# Patient Record
Sex: Female | Born: 1955 | Race: White | Hispanic: No | Marital: Married | Smoking: Former smoker
Health system: Southern US, Community
[De-identification: ages and names within clinical notes are randomized; demographics above are authoritative.]

## PROBLEM LIST (undated history)

## (undated) DIAGNOSIS — M1990 Unspecified osteoarthritis, unspecified site: Secondary | ICD-10-CM

## (undated) DIAGNOSIS — Z9889 Other specified postprocedural states: Secondary | ICD-10-CM

## (undated) DIAGNOSIS — R251 Tremor, unspecified: Secondary | ICD-10-CM

## (undated) DIAGNOSIS — E2839 Other primary ovarian failure: Secondary | ICD-10-CM

## (undated) DIAGNOSIS — K5792 Diverticulitis of intestine, part unspecified, without perforation or abscess without bleeding: Secondary | ICD-10-CM

## (undated) DIAGNOSIS — F419 Anxiety disorder, unspecified: Secondary | ICD-10-CM

## (undated) DIAGNOSIS — K219 Gastro-esophageal reflux disease without esophagitis: Secondary | ICD-10-CM

## (undated) DIAGNOSIS — R112 Nausea with vomiting, unspecified: Secondary | ICD-10-CM

## (undated) DIAGNOSIS — E785 Hyperlipidemia, unspecified: Secondary | ICD-10-CM

## (undated) DIAGNOSIS — N3281 Overactive bladder: Secondary | ICD-10-CM

## (undated) DIAGNOSIS — I10 Essential (primary) hypertension: Secondary | ICD-10-CM

## (undated) DIAGNOSIS — R7303 Prediabetes: Secondary | ICD-10-CM

## (undated) DIAGNOSIS — Z720 Tobacco use: Secondary | ICD-10-CM

## (undated) DIAGNOSIS — E669 Obesity, unspecified: Secondary | ICD-10-CM

## (undated) DIAGNOSIS — F32A Depression, unspecified: Secondary | ICD-10-CM

## (undated) DIAGNOSIS — T8859XA Other complications of anesthesia, initial encounter: Secondary | ICD-10-CM

## (undated) DIAGNOSIS — E559 Vitamin D deficiency, unspecified: Secondary | ICD-10-CM

## (undated) HISTORY — PX: ABDOMINAL HYSTERECTOMY: SHX81

## (undated) HISTORY — PX: BACK SURGERY: SHX140

## (undated) HISTORY — DX: Tremor, unspecified: R25.1

## (undated) HISTORY — DX: Depression, unspecified: F32.A

## (undated) HISTORY — DX: Other primary ovarian failure: E28.39

## (undated) HISTORY — DX: Vitamin D deficiency, unspecified: E55.9

## (undated) HISTORY — DX: Hyperlipidemia, unspecified: E78.5

## (undated) HISTORY — DX: Unspecified osteoarthritis, unspecified site: M19.90

## (undated) HISTORY — DX: Prediabetes: R73.03

## (undated) HISTORY — DX: Obesity, unspecified: E66.9

## (undated) HISTORY — DX: Anxiety disorder, unspecified: F41.9

## (undated) HISTORY — PX: WISDOM TOOTH EXTRACTION: SHX21

## (undated) HISTORY — DX: Gastro-esophageal reflux disease without esophagitis: K21.9

## (undated) HISTORY — DX: Overactive bladder: N32.81

---

## 1998-05-30 ENCOUNTER — Ambulatory Visit (HOSPITAL_COMMUNITY): Admission: RE | Admit: 1998-05-30 | Discharge: 1998-05-30 | Payer: Self-pay | Admitting: Family Medicine

## 1998-07-17 ENCOUNTER — Encounter: Admission: RE | Admit: 1998-07-17 | Discharge: 1998-09-16 | Payer: Self-pay | Admitting: Neurosurgery

## 1998-08-21 ENCOUNTER — Ambulatory Visit (HOSPITAL_COMMUNITY): Admission: RE | Admit: 1998-08-21 | Discharge: 1998-08-21 | Payer: Self-pay | Admitting: Neurosurgery

## 1998-08-21 ENCOUNTER — Encounter: Payer: Self-pay | Admitting: Neurosurgery

## 1998-09-04 ENCOUNTER — Encounter: Payer: Self-pay | Admitting: Neurosurgery

## 1998-09-04 ENCOUNTER — Ambulatory Visit (HOSPITAL_COMMUNITY): Admission: RE | Admit: 1998-09-04 | Discharge: 1998-09-04 | Payer: Self-pay | Admitting: Neurosurgery

## 1998-09-18 ENCOUNTER — Encounter: Payer: Self-pay | Admitting: Neurosurgery

## 1998-09-18 ENCOUNTER — Ambulatory Visit (HOSPITAL_COMMUNITY): Admission: RE | Admit: 1998-09-18 | Discharge: 1998-09-18 | Payer: Self-pay | Admitting: Neurosurgery

## 1998-10-09 ENCOUNTER — Encounter: Payer: Self-pay | Admitting: Neurosurgery

## 1998-10-11 ENCOUNTER — Inpatient Hospital Stay (HOSPITAL_COMMUNITY): Admission: RE | Admit: 1998-10-11 | Discharge: 1998-10-12 | Payer: Self-pay | Admitting: Neurosurgery

## 1998-10-11 ENCOUNTER — Encounter: Payer: Self-pay | Admitting: Neurosurgery

## 1998-12-11 ENCOUNTER — Encounter: Payer: Self-pay | Admitting: Neurosurgery

## 1998-12-11 ENCOUNTER — Ambulatory Visit (HOSPITAL_COMMUNITY): Admission: RE | Admit: 1998-12-11 | Discharge: 1998-12-11 | Payer: Self-pay | Admitting: Neurosurgery

## 1999-09-04 ENCOUNTER — Encounter: Admission: RE | Admit: 1999-09-04 | Discharge: 1999-09-04 | Payer: Self-pay | Admitting: Family Medicine

## 1999-09-04 ENCOUNTER — Encounter: Payer: Self-pay | Admitting: Family Medicine

## 1999-12-26 ENCOUNTER — Encounter: Admission: RE | Admit: 1999-12-26 | Discharge: 1999-12-26 | Payer: Self-pay | Admitting: Family Medicine

## 2000-01-20 ENCOUNTER — Emergency Department (HOSPITAL_COMMUNITY): Admission: EM | Admit: 2000-01-20 | Discharge: 2000-01-20 | Payer: Self-pay | Admitting: Internal Medicine

## 2000-01-20 ENCOUNTER — Encounter: Payer: Self-pay | Admitting: Emergency Medicine

## 2000-01-27 ENCOUNTER — Encounter: Admission: RE | Admit: 2000-01-27 | Discharge: 2000-01-27 | Payer: Self-pay | Admitting: Family Medicine

## 2000-01-27 ENCOUNTER — Encounter: Payer: Self-pay | Admitting: Family Medicine

## 2000-03-08 ENCOUNTER — Encounter: Payer: Self-pay | Admitting: Family Medicine

## 2000-03-08 ENCOUNTER — Ambulatory Visit (HOSPITAL_COMMUNITY): Admission: RE | Admit: 2000-03-08 | Discharge: 2000-03-08 | Payer: Self-pay | Admitting: Family Medicine

## 2000-04-23 ENCOUNTER — Other Ambulatory Visit: Admission: RE | Admit: 2000-04-23 | Discharge: 2000-04-23 | Payer: Self-pay | Admitting: Obstetrics and Gynecology

## 2000-08-31 ENCOUNTER — Inpatient Hospital Stay (HOSPITAL_COMMUNITY): Admission: EM | Admit: 2000-08-31 | Discharge: 2000-09-01 | Payer: Self-pay | Admitting: Emergency Medicine

## 2000-08-31 ENCOUNTER — Encounter: Payer: Self-pay | Admitting: Internal Medicine

## 2000-09-01 ENCOUNTER — Encounter: Payer: Self-pay | Admitting: Internal Medicine

## 2000-10-27 ENCOUNTER — Ambulatory Visit (HOSPITAL_COMMUNITY): Admission: RE | Admit: 2000-10-27 | Discharge: 2000-10-27 | Payer: Self-pay | Admitting: Internal Medicine

## 2000-12-14 ENCOUNTER — Encounter: Payer: Self-pay | Admitting: Family Medicine

## 2000-12-14 ENCOUNTER — Encounter: Admission: RE | Admit: 2000-12-14 | Discharge: 2000-12-14 | Payer: Self-pay | Admitting: Family Medicine

## 2001-01-14 ENCOUNTER — Encounter: Payer: Self-pay | Admitting: Family Medicine

## 2001-01-14 ENCOUNTER — Encounter: Admission: RE | Admit: 2001-01-14 | Discharge: 2001-01-14 | Payer: Self-pay | Admitting: Family Medicine

## 2001-10-23 ENCOUNTER — Emergency Department (HOSPITAL_COMMUNITY): Admission: EM | Admit: 2001-10-23 | Discharge: 2001-10-24 | Payer: Self-pay | Admitting: Emergency Medicine

## 2001-10-23 ENCOUNTER — Encounter: Payer: Self-pay | Admitting: Emergency Medicine

## 2002-02-15 ENCOUNTER — Encounter: Admission: RE | Admit: 2002-02-15 | Discharge: 2002-02-15 | Payer: Self-pay | Admitting: Nurse Practitioner

## 2002-02-15 ENCOUNTER — Encounter: Payer: Self-pay | Admitting: Family Medicine

## 2002-11-28 ENCOUNTER — Other Ambulatory Visit: Admission: RE | Admit: 2002-11-28 | Discharge: 2002-11-28 | Payer: Self-pay | Admitting: Gynecology

## 2003-02-16 ENCOUNTER — Ambulatory Visit (HOSPITAL_BASED_OUTPATIENT_CLINIC_OR_DEPARTMENT_OTHER): Admission: RE | Admit: 2003-02-16 | Discharge: 2003-02-16 | Payer: Self-pay | Admitting: Urology

## 2003-03-05 ENCOUNTER — Encounter: Payer: Self-pay | Admitting: Family Medicine

## 2003-03-05 ENCOUNTER — Ambulatory Visit (HOSPITAL_COMMUNITY): Admission: RE | Admit: 2003-03-05 | Discharge: 2003-03-05 | Payer: Self-pay | Admitting: Family Medicine

## 2003-03-30 ENCOUNTER — Encounter: Payer: Self-pay | Admitting: Family Medicine

## 2003-03-30 ENCOUNTER — Encounter: Admission: RE | Admit: 2003-03-30 | Discharge: 2003-03-30 | Payer: Self-pay | Admitting: Family Medicine

## 2004-03-17 ENCOUNTER — Encounter: Admission: RE | Admit: 2004-03-17 | Discharge: 2004-03-17 | Payer: Self-pay | Admitting: Family Medicine

## 2004-10-21 ENCOUNTER — Encounter: Admission: RE | Admit: 2004-10-21 | Discharge: 2004-10-21 | Payer: Self-pay | Admitting: Family Medicine

## 2004-10-23 ENCOUNTER — Encounter: Admission: RE | Admit: 2004-10-23 | Discharge: 2004-10-23 | Payer: Self-pay | Admitting: Family Medicine

## 2005-01-05 ENCOUNTER — Encounter: Admission: RE | Admit: 2005-01-05 | Discharge: 2005-01-05 | Payer: Self-pay | Admitting: Family Medicine

## 2005-03-30 ENCOUNTER — Emergency Department (HOSPITAL_COMMUNITY): Admission: EM | Admit: 2005-03-30 | Discharge: 2005-03-30 | Payer: Self-pay | Admitting: Emergency Medicine

## 2005-03-30 ENCOUNTER — Encounter: Admission: RE | Admit: 2005-03-30 | Discharge: 2005-03-30 | Payer: Self-pay | Admitting: Obstetrics and Gynecology

## 2005-12-16 ENCOUNTER — Encounter: Payer: Self-pay | Admitting: Family Medicine

## 2005-12-23 ENCOUNTER — Ambulatory Visit: Payer: Self-pay | Admitting: Internal Medicine

## 2005-12-28 ENCOUNTER — Ambulatory Visit: Payer: Self-pay | Admitting: Internal Medicine

## 2006-02-05 ENCOUNTER — Ambulatory Visit: Payer: Self-pay | Admitting: Internal Medicine

## 2006-04-02 ENCOUNTER — Encounter: Admission: RE | Admit: 2006-04-02 | Discharge: 2006-04-02 | Payer: Self-pay | Admitting: *Deleted

## 2006-04-14 ENCOUNTER — Encounter: Admission: RE | Admit: 2006-04-14 | Discharge: 2006-04-14 | Payer: Self-pay | Admitting: *Deleted

## 2007-01-24 ENCOUNTER — Encounter: Admission: RE | Admit: 2007-01-24 | Discharge: 2007-01-24 | Payer: Self-pay | Admitting: *Deleted

## 2007-08-25 ENCOUNTER — Encounter: Admission: RE | Admit: 2007-08-25 | Discharge: 2007-08-25 | Payer: Self-pay | Admitting: Family Medicine

## 2007-08-31 ENCOUNTER — Encounter: Admission: RE | Admit: 2007-08-31 | Discharge: 2007-08-31 | Payer: Self-pay

## 2008-01-09 ENCOUNTER — Emergency Department (HOSPITAL_COMMUNITY): Admission: EM | Admit: 2008-01-09 | Discharge: 2008-01-10 | Payer: Self-pay | Admitting: Emergency Medicine

## 2008-09-25 ENCOUNTER — Encounter: Admission: RE | Admit: 2008-09-25 | Discharge: 2008-09-25 | Payer: Self-pay | Admitting: Family Medicine

## 2008-12-30 ENCOUNTER — Ambulatory Visit: Payer: Self-pay | Admitting: Infectious Diseases

## 2008-12-30 ENCOUNTER — Inpatient Hospital Stay (HOSPITAL_COMMUNITY): Admission: EM | Admit: 2008-12-30 | Discharge: 2009-01-03 | Payer: Self-pay | Admitting: Emergency Medicine

## 2009-09-27 ENCOUNTER — Encounter: Admission: RE | Admit: 2009-09-27 | Discharge: 2009-09-27 | Payer: Self-pay | Admitting: Family Medicine

## 2009-12-17 ENCOUNTER — Encounter: Admission: RE | Admit: 2009-12-17 | Discharge: 2009-12-17 | Payer: Self-pay | Admitting: Family Medicine

## 2010-08-24 ENCOUNTER — Emergency Department (HOSPITAL_COMMUNITY)
Admission: EM | Admit: 2010-08-24 | Discharge: 2010-08-25 | Payer: Self-pay | Source: Home / Self Care | Admitting: Emergency Medicine

## 2010-09-01 LAB — POCT I-STAT, CHEM 8
BUN: 11 mg/dL (ref 6–23)
Calcium, Ion: 1.08 mmol/L — ABNORMAL LOW (ref 1.12–1.32)
Chloride: 107 mEq/L (ref 96–112)
Creatinine, Ser: 0.8 mg/dL (ref 0.4–1.2)
Glucose, Bld: 118 mg/dL — ABNORMAL HIGH (ref 70–99)
HCT: 37 % (ref 36.0–46.0)
Hemoglobin: 12.6 g/dL (ref 12.0–15.0)
Potassium: 3.5 mEq/L (ref 3.5–5.1)
Sodium: 143 mEq/L (ref 135–145)
TCO2: 23 mmol/L (ref 0–100)

## 2010-09-07 ENCOUNTER — Encounter: Payer: Self-pay | Admitting: *Deleted

## 2010-10-14 ENCOUNTER — Other Ambulatory Visit: Payer: Self-pay | Admitting: Family Medicine

## 2010-10-14 DIAGNOSIS — Z1231 Encounter for screening mammogram for malignant neoplasm of breast: Secondary | ICD-10-CM

## 2010-10-21 ENCOUNTER — Ambulatory Visit: Payer: Self-pay

## 2010-10-24 ENCOUNTER — Ambulatory Visit
Admission: RE | Admit: 2010-10-24 | Discharge: 2010-10-24 | Disposition: A | Discharge status: Home / Self Care | Payer: BC Managed Care – PPO | Source: Ambulatory Visit | Attending: Family Medicine | Admitting: Family Medicine

## 2010-10-24 DIAGNOSIS — Z1231 Encounter for screening mammogram for malignant neoplasm of breast: Secondary | ICD-10-CM

## 2010-11-25 LAB — BASIC METABOLIC PANEL
BUN: 2 mg/dL — ABNORMAL LOW (ref 6–23)
BUN: 7 mg/dL (ref 6–23)
CO2: 25 mEq/L (ref 19–32)
Chloride: 106 mEq/L (ref 96–112)
Chloride: 107 mEq/L (ref 96–112)
Creatinine, Ser: 0.65 mg/dL (ref 0.4–1.2)
Creatinine, Ser: 0.8 mg/dL (ref 0.4–1.2)
Glucose, Bld: 124 mg/dL — ABNORMAL HIGH (ref 70–99)

## 2010-11-25 LAB — COMPREHENSIVE METABOLIC PANEL
AST: 16 U/L (ref 0–37)
AST: 35 U/L (ref 0–37)
Albumin: 2.6 g/dL — ABNORMAL LOW (ref 3.5–5.2)
Albumin: 3.8 g/dL (ref 3.5–5.2)
Alkaline Phosphatase: 64 U/L (ref 39–117)
BUN: 8 mg/dL (ref 6–23)
Chloride: 105 mEq/L (ref 96–112)
Chloride: 107 mEq/L (ref 96–112)
Creatinine, Ser: 0.7 mg/dL (ref 0.4–1.2)
GFR calc Af Amer: >60 mL/min (ref >60)
GFR calc Af Amer: >60 mL/min (ref >60)
Potassium: 3.4 mEq/L — ABNORMAL LOW (ref 3.5–5.1)
Potassium: 3.5 mEq/L (ref 3.5–5.1)
Total Bilirubin: 0.8 mg/dL (ref 0.3–1.2)
Total Bilirubin: 1 mg/dL (ref 0.3–1.2)
Total Protein: 7.7 g/dL (ref 6.0–8.3)

## 2010-11-25 LAB — CBC
MCHC: 34.4 g/dL (ref 30.0–36.0)
MCV: 91.9 fL (ref 78.0–100.0)
MCV: 92.2 fL (ref 78.0–100.0)
MCV: 92.8 fL (ref 78.0–100.0)
Platelets: 156 10*3/uL (ref 150–400)
Platelets: 175 10*3/uL (ref 150–400)
Platelets: 200 10*3/uL (ref 150–400)
Platelets: 213 10*3/uL (ref 150–400)
RDW: 13 % (ref 11.5–15.5)
RDW: 13.8 % (ref 11.5–15.5)
WBC: 11.2 10*3/uL — ABNORMAL HIGH (ref 4.0–10.5)
WBC: 12.8 10*3/uL — ABNORMAL HIGH (ref 4.0–10.5)
WBC: 7.3 10*3/uL (ref 4.0–10.5)
WBC: 9.8 10*3/uL (ref 4.0–10.5)

## 2010-11-25 LAB — DIFFERENTIAL
Basophils Absolute: 0 10*3/uL (ref 0.0–0.1)
Basophils Absolute: 0 10*3/uL (ref 0.0–0.1)
Basophils Relative: 0 % (ref 0–1)
Eosinophils Relative: 0 % (ref 0–5)
Eosinophils Relative: 1 % (ref 0–5)
Lymphocytes Relative: 18 % (ref 12–46)
Lymphocytes Relative: 9 % — ABNORMAL LOW (ref 12–46)
Lymphs Abs: 1.3 10*3/uL (ref 0.7–4.0)
Monocytes Absolute: 0.5 10*3/uL (ref 0.1–1.0)
Monocytes Absolute: 0.7 10*3/uL (ref 0.1–1.0)
Monocytes Relative: 7 % (ref 3–12)
Neutro Abs: 5.5 10*3/uL (ref 1.7–7.7)

## 2010-11-25 LAB — URINALYSIS, ROUTINE W REFLEX MICROSCOPIC
Ketones, ur: NEGATIVE mg/dL
Protein, ur: NEGATIVE mg/dL
Urobilinogen, UA: 0.2 mg/dL (ref 0.0–1.0)

## 2010-11-25 LAB — CULTURE, BLOOD (ROUTINE X 2): Culture: NO GROWTH

## 2010-12-30 NOTE — H&P (Signed)
NAME:  Lori Lester, Lori Lester NO.:  [id number]   MEDICAL RECORD NO.:  [id number]          PATIENT TYPE:  INP   LOCATION:  1539                         FACILITY:  [facility]   PHYSICIAN:  Mick Sell, MD DATE OF BIRTH:  1956/07/29   DATE OF ADMISSION:  12/31/2008  DATE OF DISCHARGE:                              HISTORY & PHYSICAL   PRIMARY CARE PHYSICIAN:  Robert A. Nicholos Johns, M.D. with [facility].   CHIEF COMPLAINT:  Abdominal pain.   HISTORY OF PRESENT ILLNESS:  This is a 55 year old woman with a history  of prior diverticulitis and colonic polyps as well as GERD who came to  the emergency room today with 3-4 days of increasing abdominal pain in  the left lower quadrant.  She had some diarrhea with this as well but no  nausea or vomiting.  She denied subjective fevers or chills.  The pain  worsened, she could not take any more, and she came into the emergency  room.  In the ED, she had a CT scan of her abdomen that showed  diverticulitis and ovarian cyst.  She received Cipro, Flagyl, Zofran,  Dilaudid and Ativan in the ED.  Currently she is sleepy but in no acute  distress.   PAST MEDICAL HISTORY:  1. Diverticulitis 3-4 years ago requiring admission  2. GERD.  3. Recurrent ovarian cysts.   PAST SURGICAL HISTORY:  1. Back surgery.  2. Partial hysterectomy.   SOCIAL HISTORY:  The patient lives with her husband, does not smoke.   MEDICATIONS:  1. Prilosec.  2. Vitamin B12 5000 mg every 3 weeks.   ALLERGIES:  CODEINE.   REVIEW OF SYSTEMS:  Eleven systems reviewed and negative except as per  HPI.   PHYSICAL EXAMINATION:  VITAL SIGNS:  The patient's temperature 102.9,  pulse 86, blood pressure 112/56 to 145/76, respirations 20, sating 94%  on room air.  GENERAL:  She is sleepy as it is the middle of the night, and in no  acute distress.  HEENT:  Pupils equal, round, react to light and accommodation.  Extraocular movements are intact.  Sclerae  anicteric.  Oropharynx clear.  NECK:  Supple.  HEART:  Regular with no murmurs.  LUNGS:  Clear to auscultation.  ABDOMEN:  Soft, mildly distended.  Positive tender to palpation in the  left lower quadrant and left flank without any rebound or guarding.  EXTREMITIES:  No clubbing, cyanosis or edema.  NEUROLOGICAL:  She is alert and oriented x3 with a grossly nonfocal  neuro exam.   STUDIES:  CT scan was done today.  She had diverticulitis of the lower  descending colon without focal abscess, but with inflammatory stranding.  She also had a left adnexal cyst.  Ultrasound of the left adnexal mass  revealed a 7 cm simple fluid-filled cyst.   LABORATORY DATA:  Sodium 139, potassium 3.4, bicarb 24, chloride 101,  BUN 8, creatinine 0.7, glucose 112, calcium 9.0, albumin 3.8, LFTs  within normal limits.  White count of 7.3, hemoglobin 13.6, platelets  213.   IMPRESSION:  1. Diverticulitis.  2. Fevers.  3. Left  adnexal mass.   RECOMMENDATION:  Will admit the patient for IV fluids and IV  antibiotics.  Place her on Cipro and Flagyl IV.  Will control her pain  and nausea with Dilaudid and Zofran.  We will give her IV fluids and  advance her from a clear diet as tolerated.  I do not think she would  meet criteria for surgical management unless she should perforate.  Her  last diverticulitis was several years ago, although, she does report a  history also of polyps and may warrant repeat colonoscopy as an  outpatient.  For her adnexal cysts, would recommend follow-up with  gynecology as an outpatient.      Mick Sell, MD  Electronically Signed     DPF/MEDQ  D:  12/31/2008  T:  12/31/2008  Job:  [id number]

## 2011-01-02 NOTE — Discharge Summary (Signed)
NAME:  Lori Lester, Lori Lester           ACCOUNT NO.:  [id number]   MEDICAL RECORD NO.:  [id number]          PATIENT TYPE:  INP   LOCATION:  1539                         FACILITY:  [facility]   PHYSICIAN:  Corinna L. Lendell Caprice, MDDATE OF BIRTH:  10/31/1955   DATE OF ADMISSION:  12/30/2008  DATE OF DISCHARGE:  01/03/2009                               DISCHARGE SUMMARY   DISCHARGE DIAGNOSES:  1. Acute diverticulitis.  2. Anxiety.  3. Left adnexal cyst.  4. Gastroesophageal reflux disease.  5. Partial hysterectomy.   DISCHARGE MEDICATIONS:  1. Ciprofloxacin 500 mg twice daily until gone.  2. Metronidazole 500 mg 8 times daily until gone.  3. Darvocet one to two every 4 hours as needed for pain.  4. Phenergan 25 mg as needed for nausea.  5. Continue Prilosec 40 mg a day.  6. Vitamin B12 injections.   FOLLOW UP:  Gynecologist in 1 month.  Follow up with primary care  physician.  Follow up with [facility] in 2 months.   CONDITION:  Stable.   CONSULTATIONS:  None.   PROCEDURES:  None.   LABS:  CBC on admission unremarkable basic metabolic panel on admission  significant for potassium of 3.4.  Liver function tests unremarkable.  Urinalysis negative blood cultures negative.   SPECIAL STUDIES:  Radiology CT of the abdomen and pelvis showed  inflammation in the pericolonic fat of the descending colon.  No  abscess.  Large cystic structure in the left adnexal region.  Ultrasound  of the pelvis showed left adnexal cyst measuring 7 cm appearing to  contain simple fluid.  Given size, however, followup is warranted.  Chest x-ray on the 19th showed low lung volumes and basilar atelectasis.   DISCHARGE DIET:  High fiber.   DISCHARGE ACTIVITY:  Ad lib.  Return to work on Jan 14, 2009.   HISTORY AND HOSPITAL COURSE:  Ms. Heiser is a 55 year old white  female who presented with abdominal pain.  Please see H and P for  details.  She was febrile to a temperature of 102.9 and had otherwise  normal  vital signs.  She had mildly distended abdomen with tenderness in  the left lower quadrant and flank.  No rebound or guarding.  CAT scan  was consistent with diverticulitis.  Incidentally found was left adnexal  cyst.  The patient was admitted for IV antibiotics.  She was n.p.o.  given pain medications nausea medications.  Her symptoms improved and  her diet was advanced.  She did have a lot of anxiety during the  hospitalization.  She does have a history of diverticulitis in the past  and therefore I have recommended she follow up with her previous  gastroenterologist.  She also has not seen her gynecologist or had a  full pelvic exam for several years and so I have recommended she follow  up with her gynecologist to evaluate the cyst which is most likely  benign.  By the time of discharge she was afebrile, tolerating a diet.  Her pain and nausea were much better.  She was ambulating and had normal  vital signs.  Total time on  the day of discharge is 45 minutes.      Corinna L. Lendell Caprice, MD  Electronically Signed     CLS/MEDQ  D:  01/24/2009  T:  01/24/2009  Job:  [id number]

## 2011-01-02 NOTE — Discharge Summary (Signed)
[place]. [facility]  Patient:    Lori Lester, Lori Lester                       MRN: [id number] Adm. Date:  [id number] Disc. Date: [id number] Attending:  Pricilla Riffle Dictator:   Delton See, P.A. CC:         Elvina Sidle, M.D.   Referring Physician Discharge [place]  HISTORY ON ADMISSION:  [name] is a 55 year old female admitted to [facility] on August 31, 2000 for evaluation of chest pain.  She had been having episodes of chest pain associated with diaphoresis, nausea, and vomiting for greater than one year.  These occur at rest and often last hours. Saturday prior to the admission she had a severe episode followed by chest pressure that has continued intermittently since that episode.  She denies exertional chest pain but does have dyspnea on exertion.  She was admitted for further evaluation.  ALLERGIES:  CODEINE.  MEDICATIONS:  None prior to admission.  PAST MEDICAL HISTORY:  Significant for back surgery, status post partial hysterectomy, history of elevated cholesterol levels.  SOCIAL HISTORY:  The patient is married, she has two children.  She smokes one pack every three days.  She has a five pack-year history of tobacco use.  The patient works in accounts payable.  HOSPITAL COURSE:  As noted, this patient was admitted to [facility] for further evaluation of chest pain.  Her pain was atypical although she has a strong family history of early coronary artery disease as well as hyperlipidemia and a history of tobacco use.  She underwent an exercise Cardiolite procedure on September 01, 2000.  This was interpreted as negative for ischemia, EF 57%, and essentially was a normal study.  A GI consult was obtained from Dr. Marina Goodell.  The patient had an upper endoscopy performed on September 01, 2000.  She was found to have a hiatal hernia, esophagitis, esophageal stricture, and duodenitis without hemorrhage.  It was recommended that  she lose weight, quit smoking, and she was placed on Protonix 40 mg daily to be taken indefinitely.  Arrangements were made to discharge the patient later that evening in improved condition.  LABORATORY DATA:  Chest x-ray was within normal limits.  Cardiac enzymes were negative.  A urinalysis was negative.  A PTT was 25, INR 1.8.  Chemistries were within normal limits.  A CBC revealed a mild anemia with hemoglobin 11.7; hematocrit 35; wbcs 8200; platelets 268,000.  DISCHARGE MEDICATION:  Protonix.  ACTIVITY:  As tolerated.  The patient was told she could return to work Monday.  DIET:  She was to stay on her previous diet.  SPECIAL INSTRUCTIONS:  She was to try to lose weight and try to quit smoking.  FOLLOW-UP:  She was to follow up with Dr. Milus Glazier as needed or as scheduled.  PROBLEM LIST AT TIME OF DISCHARGE: 1. Chest pain with negative cardiac enzymes. 2. Negative exercise Cardiolite performed September 01, 2000.  No ischemia,    ejection fraction 57%. 3. Gastrointestinal consult with upper endoscopy September 01, 2000 revealing    hiatal hernia, inflammation, stricture, and esophagitis, with duodenitis    without hemorrhage. 4. History of tobacco use. 5. Obesity. 6. Strong family history of early coronary disease. 7. History of back surgery. 8. History of elevated cholesterol levels. DD:  09/01/00 TD:  09/02/00 Job: [id number]

## 2011-01-24 ENCOUNTER — Encounter: Payer: Self-pay | Admitting: Internal Medicine

## 2011-05-13 LAB — LIPASE, BLOOD: Lipase: 19

## 2011-05-13 LAB — POCT I-STAT, CHEM 8
BUN: 10
Creatinine, Ser: 0.7
Potassium: 3.9
Sodium: 140

## 2011-05-13 LAB — URINALYSIS, ROUTINE W REFLEX MICROSCOPIC
Glucose, UA: NEGATIVE
Hgb urine dipstick: NEGATIVE
Specific Gravity, Urine: 1.017
Urobilinogen, UA: 0.2

## 2011-05-13 LAB — HEPATIC FUNCTION PANEL
AST: 22
Albumin: 3.6
Total Protein: 7.1

## 2011-05-13 LAB — CBC
MCHC: 34.3
Platelets: 221
RBC: 4.15
RDW: 14.1

## 2011-05-13 LAB — DIFFERENTIAL
Basophils Absolute: 0
Basophils Relative: 0
Eosinophils Absolute: 0.1
Monocytes Relative: 8
Neutro Abs: 5.9
Neutrophils Relative %: 62

## 2011-05-13 LAB — D-DIMER, QUANTITATIVE: D-Dimer, Quant: <0.22

## 2011-10-23 ENCOUNTER — Other Ambulatory Visit: Payer: Self-pay | Admitting: Family Medicine

## 2011-10-23 DIAGNOSIS — Z1231 Encounter for screening mammogram for malignant neoplasm of breast: Secondary | ICD-10-CM

## 2011-11-11 ENCOUNTER — Ambulatory Visit
Admission: RE | Admit: 2011-11-11 | Discharge: 2011-11-11 | Disposition: A | Discharge status: Home / Self Care | Payer: BC Managed Care – PPO | Source: Ambulatory Visit | Attending: Family Medicine | Admitting: Family Medicine

## 2011-11-11 DIAGNOSIS — Z1231 Encounter for screening mammogram for malignant neoplasm of breast: Secondary | ICD-10-CM

## 2012-05-12 ENCOUNTER — Encounter: Payer: Self-pay | Admitting: Internal Medicine

## 2012-11-04 ENCOUNTER — Other Ambulatory Visit: Payer: Self-pay | Admitting: Family Medicine

## 2012-11-04 DIAGNOSIS — N6310 Unspecified lump in the right breast, unspecified quadrant: Secondary | ICD-10-CM

## 2012-11-07 ENCOUNTER — Ambulatory Visit
Admission: RE | Admit: 2012-11-07 | Discharge: 2012-11-07 | Disposition: A | Discharge status: Home / Self Care | Payer: BC Managed Care – PPO | Source: Ambulatory Visit | Attending: Family Medicine | Admitting: Family Medicine

## 2012-11-07 DIAGNOSIS — N6310 Unspecified lump in the right breast, unspecified quadrant: Secondary | ICD-10-CM

## 2012-11-15 ENCOUNTER — Other Ambulatory Visit: Payer: Self-pay | Admitting: Gastroenterology

## 2012-11-15 DIAGNOSIS — R109 Unspecified abdominal pain: Secondary | ICD-10-CM

## 2012-11-18 ENCOUNTER — Ambulatory Visit
Admission: RE | Admit: 2012-11-18 | Discharge: 2012-11-18 | Disposition: A | Discharge status: Home / Self Care | Payer: BC Managed Care – PPO | Source: Ambulatory Visit | Attending: Gastroenterology | Admitting: Gastroenterology

## 2012-11-18 DIAGNOSIS — R109 Unspecified abdominal pain: Secondary | ICD-10-CM

## 2012-11-18 IMAGING — US US ABDOMEN COMPLETE
1 series · 14 of 25 positions shown · non-contrast
Comparison: [DATE]

CLINICAL DATA: Nausea/vomiting, right upper quadrant abdominal
pain

COMPLETE ABDOMINAL ULTRASOUND

[Series 1: us abdomen complete · 0.31mm/px · 14 of 76 slices shown]
[im 1/76]
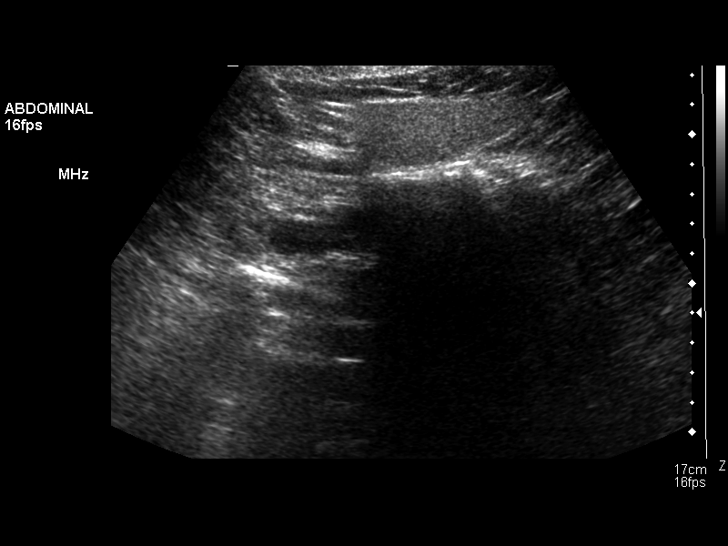
[im 7/76]
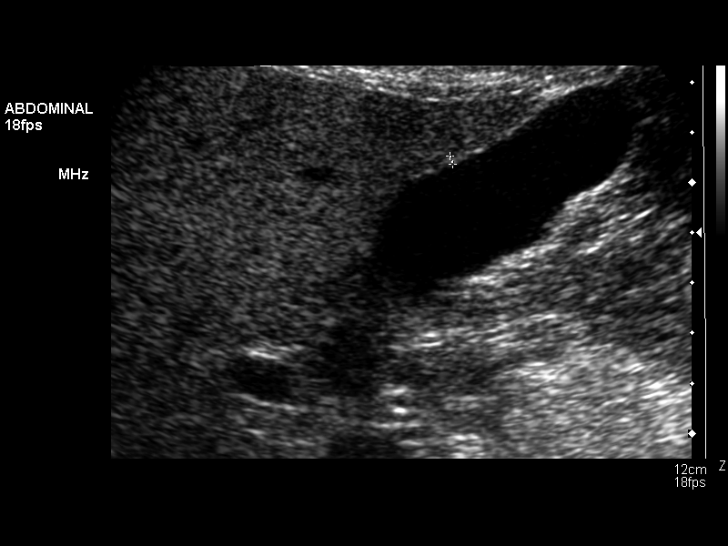
[im 13/76]
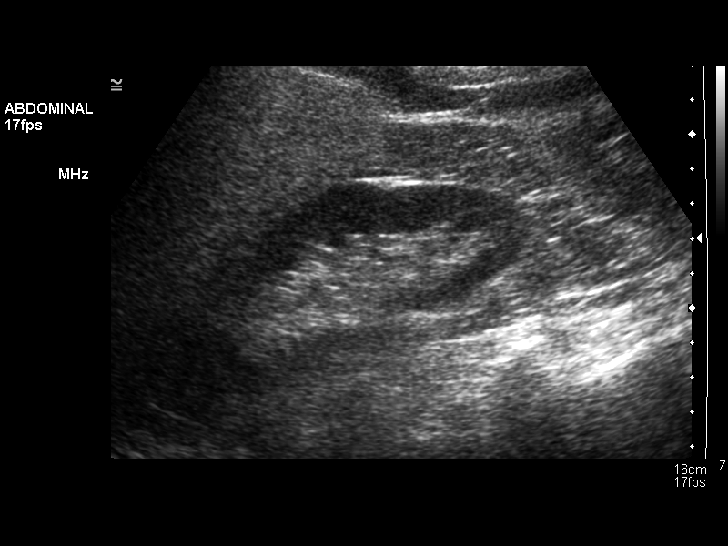
[im 19/76]
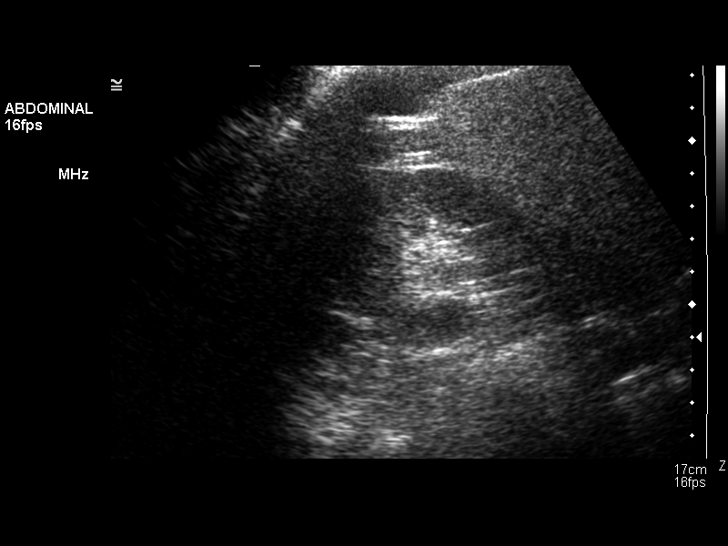
[im 26/76]
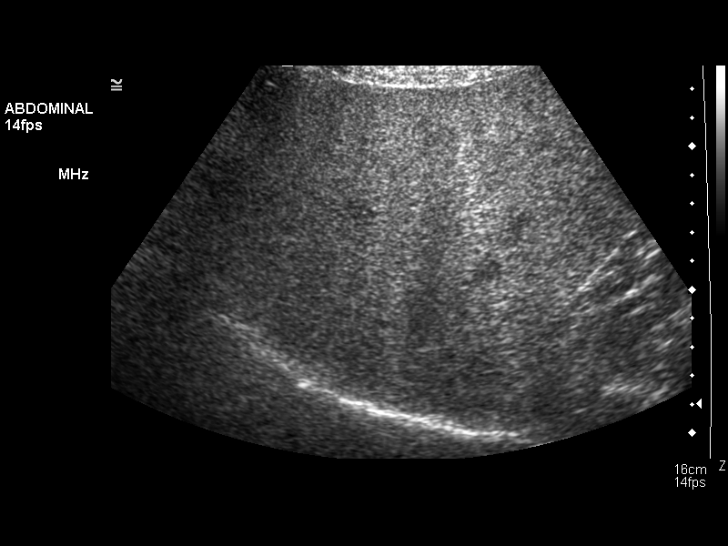
[im 29/76]
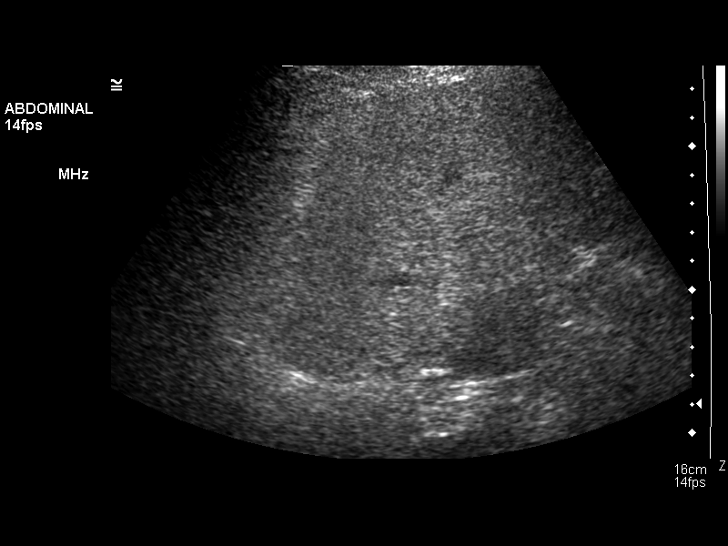
[im 35/76]
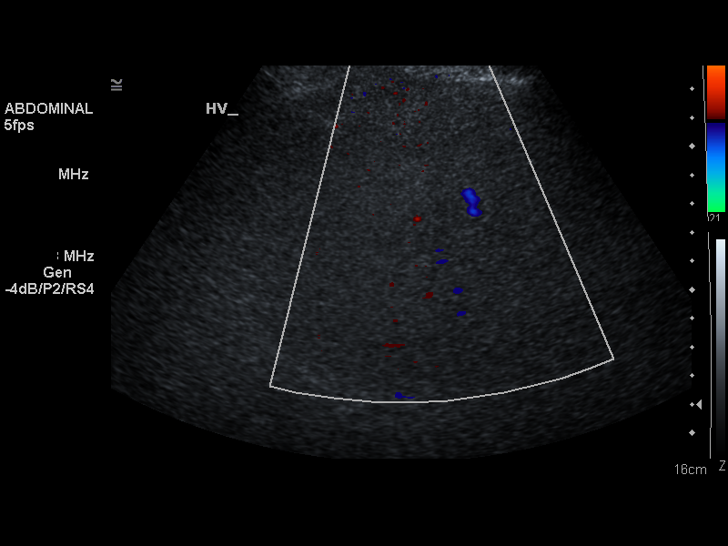
[im 41/76]
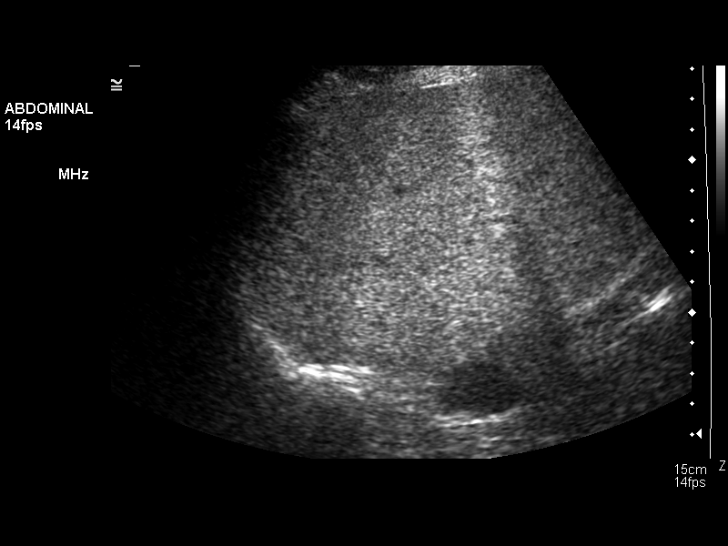
[im 47/76]
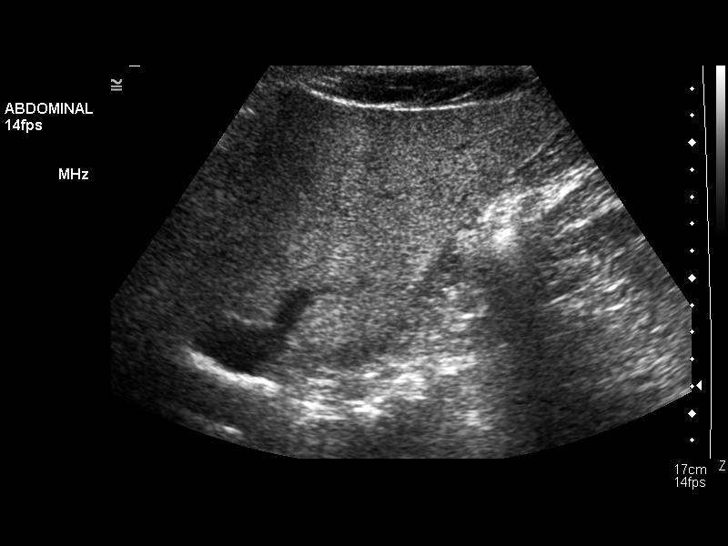
[im 51/76]
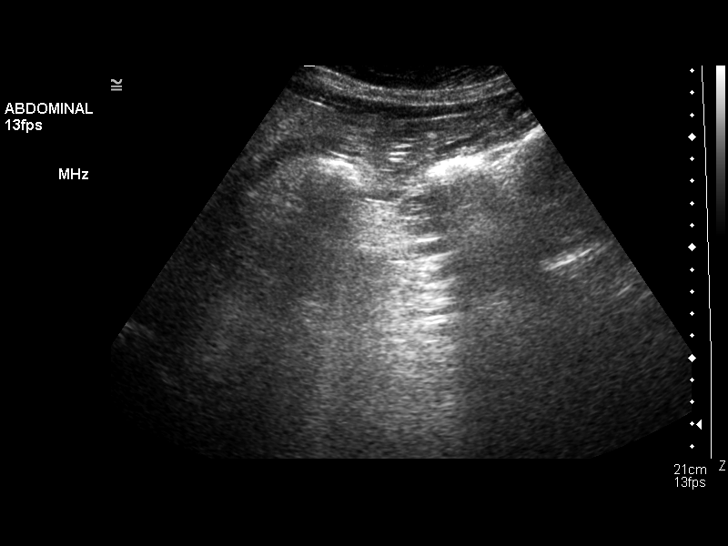
[im 57/76]
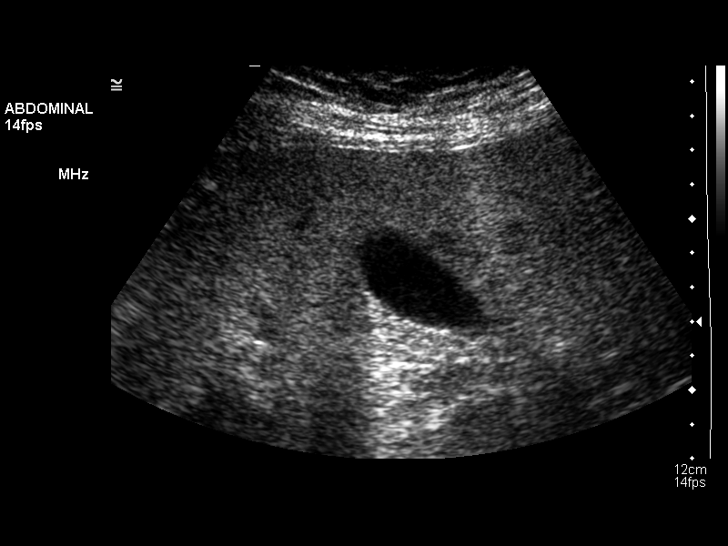
[im 63/76]
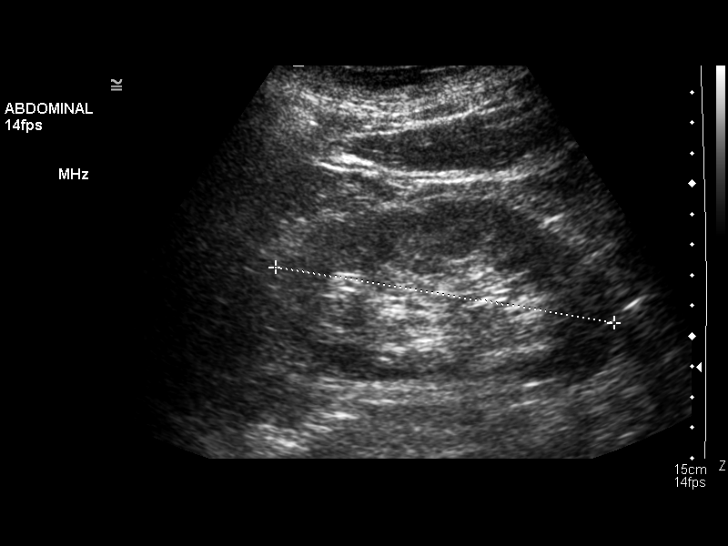
[im 69/76]
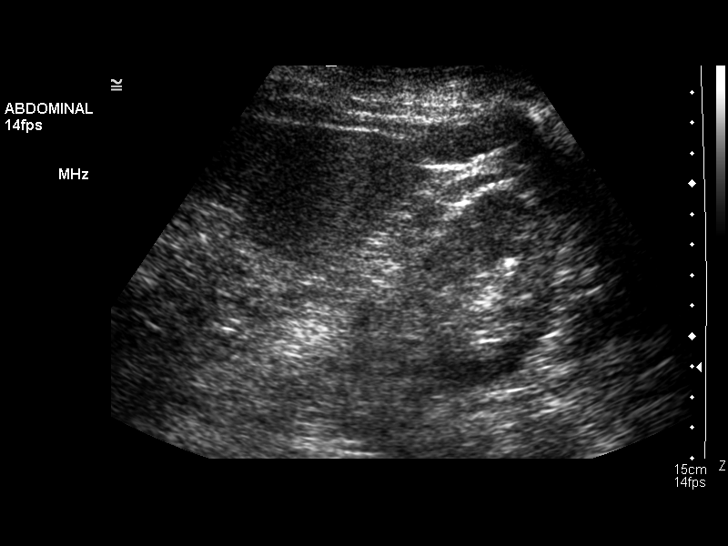
[im 76/76]
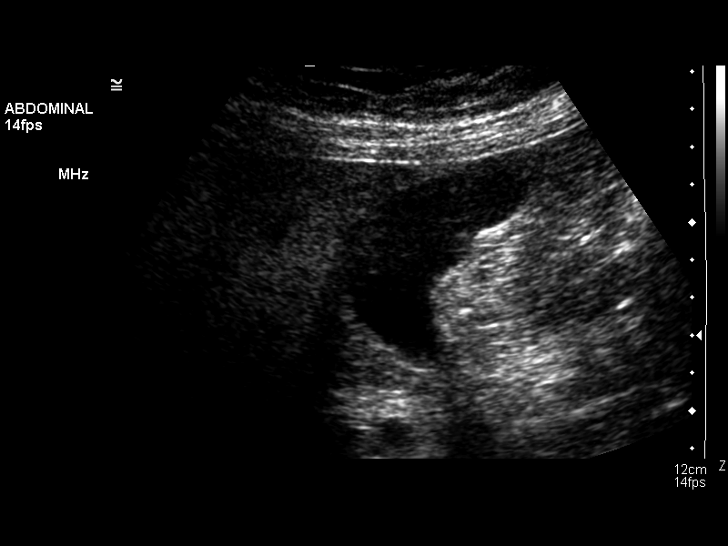

[14 of 25 positions shown; findings below may reference images not displayed]

FINDINGS: Gallbladder:  No gallstones, gallbladder wall thickening, or
pericholecystic fluid.  Negative sonographic Murphy's sign.

Common bile duct:  Measures 7.5 mm.

Liver:  No focal lesion identified.  Hyperechoic hepatic
parenchyma, reflecting hepatic steatosis.

IVC:  Not visualized.

Pancreas:  Not visualized due to overlying bowel gas.

Spleen:  Measures 8.8 cm.

Right Kidney:  Measures 12.3 cm.  No mass or hydronephrosis.

Left Kidney:  Measures 12.1 cm.  No mass or hydronephrosis.

Abdominal aorta:  Not visualized due to overlying bowel gas.
IMPRESSION: Hepatic steatosis.

Normal sonographic appearance of the gallbladder.

Common bile duct measures 7.5 mm, mildly prominent.  No
choledocholithiasis is seen by US.  Correlate with LFTs; if
abnormal, consider MRI abdomen with/without contrast for further
evaluation.

## 2012-12-05 ENCOUNTER — Emergency Department (HOSPITAL_COMMUNITY): Payer: BC Managed Care – PPO

## 2012-12-05 ENCOUNTER — Encounter (HOSPITAL_COMMUNITY): Payer: Self-pay

## 2012-12-05 ENCOUNTER — Emergency Department (HOSPITAL_COMMUNITY)
Admission: EM | Admit: 2012-12-05 | Discharge: 2012-12-05 | Disposition: A | Discharge status: Home / Self Care | Payer: BC Managed Care – PPO | Attending: Emergency Medicine | Admitting: Emergency Medicine

## 2012-12-05 ENCOUNTER — Other Ambulatory Visit: Payer: Self-pay

## 2012-12-05 DIAGNOSIS — Z8719 Personal history of other diseases of the digestive system: Secondary | ICD-10-CM | POA: Insufficient documentation

## 2012-12-05 DIAGNOSIS — R251 Tremor, unspecified: Secondary | ICD-10-CM

## 2012-12-05 DIAGNOSIS — F411 Generalized anxiety disorder: Secondary | ICD-10-CM | POA: Insufficient documentation

## 2012-12-05 DIAGNOSIS — R0989 Other specified symptoms and signs involving the circulatory and respiratory systems: Secondary | ICD-10-CM | POA: Insufficient documentation

## 2012-12-05 DIAGNOSIS — Z79899 Other long term (current) drug therapy: Secondary | ICD-10-CM | POA: Insufficient documentation

## 2012-12-05 DIAGNOSIS — R259 Unspecified abnormal involuntary movements: Secondary | ICD-10-CM | POA: Insufficient documentation

## 2012-12-05 DIAGNOSIS — I10 Essential (primary) hypertension: Secondary | ICD-10-CM | POA: Insufficient documentation

## 2012-12-05 DIAGNOSIS — R002 Palpitations: Secondary | ICD-10-CM | POA: Insufficient documentation

## 2012-12-05 HISTORY — DX: Diverticulitis of intestine, part unspecified, without perforation or abscess without bleeding: K57.92

## 2012-12-05 HISTORY — DX: Essential (primary) hypertension: I10

## 2012-12-05 LAB — CBC WITH DIFFERENTIAL/PLATELET
Basophils Absolute: 0 10*3/uL (ref 0.0–0.1)
Basophils Relative: 1 % (ref 0–1)
Eosinophils Absolute: 0 10*3/uL (ref 0.0–0.7)
HCT: 36.5 % (ref 36.0–46.0)
MCHC: 34.8 g/dL (ref 30.0–36.0)
Monocytes Absolute: 0.5 10*3/uL (ref 0.1–1.0)
Neutro Abs: 6.1 10*3/uL (ref 1.7–7.7)
RDW: 13.6 % (ref 11.5–15.5)

## 2012-12-05 LAB — BASIC METABOLIC PANEL
Calcium: 9.2 mg/dL (ref 8.4–10.5)
Chloride: 105 mEq/L (ref 96–112)
Creatinine, Ser: 0.78 mg/dL (ref 0.50–1.10)
GFR calc Af Amer: >90 mL/min (ref >90)
GFR calc non Af Amer: >90 mL/min (ref >90)

## 2012-12-05 LAB — TROPONIN I: Troponin I: <0.3 ng/mL (ref <0.30)

## 2012-12-05 MED ORDER — LORAZEPAM 2 MG/ML IJ SOLN
1.0000 mg | Freq: Once | INTRAMUSCULAR | Status: AC
  Administered 2012-12-05: 1 mg via INTRAVENOUS
  Filled 2012-12-05: qty 1

## 2012-12-05 MED ORDER — LORAZEPAM 1 MG PO TABS: 1.0000 mg | ORAL_TABLET | Freq: Three times a day (TID) | ORAL | Status: DC | PRN

## 2012-12-05 NOTE — ED Notes (Signed)
Orthostatic VS   Lying 111/68 P 82, Sitting 111/84 P 89, Standing 130/79 P 103

## 2012-12-05 NOTE — ED Notes (Signed)
Pt. Reports Friday felt lightheaded and had chest pressure. States that "I just felt off". Pt. States the feeling passed but afterward was having low BP. Pt. Also reports achy occipital head pain.

## 2012-12-05 NOTE — ED Notes (Signed)
Pt alert, NAD, calm, interactive, resps e/u, speaking in clear complete sentences, family at [facility], tremor present, ativan given, warm blanket given, pt to CT.

## 2012-12-05 NOTE — ED Provider Notes (Signed)
History     CSN: [id number]  Arrival date & time 12/05/12  0010   First MD Initiated Contact with Patient 12/05/12 0020      Chief Complaint  Patient presents with  . Tremors  . Palpitations    (Consider location/radiation/quality/duration/timing/severity/associated sxs/prior treatment) HPI 57 yo female presents to the ER with complaint of tremors.  Pt reports acute onset of severe diffuse body tremors tonight as she was getting ready for bed.  No prior h/o same.  Pt reports she was "shaky" last weekend associated with low bp.  She was seen by her pcp, on Monday, who reduced her bp medications.  Pt also taken off her wellbutrin which was started 2 weeks ago for menopause symptoms per pt.  No other new medications OTC herbal or otherwise.  Pt reports she had chest discomfort and palpations on Friday, none since.  She has had intermittent discomfort to her right posterior head.  No focal weakness, numbness.  Her father has "tremor I think maybe parkinsons".   Tremor uncontrollable, not noticed to be worse with motion or rest Past Medical History  Diagnosis Date  . Hypertension   . Diverticulitis     Past Surgical History  Procedure Laterality Date  . Abdominal hysterectomy    . Back surgery      No family history on file.  History  Substance Use Topics  . Smoking status: Never Smoker   . Smokeless tobacco: Not on file  . Alcohol Use: No    OB History   Grav Para Term Preterm Abortions TAB SAB Ect Mult Living                  Review of Systems  All other systems reviewed and are negative.    Allergies  Codeine and Sulfa antibiotics  Home Medications   Current Outpatient Rx  Name  Route  Sig  Dispense  Refill  . amLODipine-valsartan (EXFORGE) 10-320 MG per tablet   Oral   Take 1 tablet by mouth daily.         Marland Kitchen atorvastatin (LIPITOR) 10 MG tablet   Oral   Take 10 mg by mouth daily.         Marland Kitchen dexlansoprazole (DEXILANT) 60 MG capsule   Oral   Take 60  mg by mouth daily.         Marland Kitchen LORazepam (ATIVAN) 1 MG tablet   Oral   Take 1 tablet (1 mg total) by mouth 3 (three) times daily as needed (tremor).   15 tablet   0     BP 113/67  Pulse 73  Temp(Src) 98.8 F (37.1 C) (Oral)  Resp 19  SpO2 97%  Physical Exam  Nursing note and vitals reviewed. Constitutional: She is oriented to person, place, and time. She appears well-developed and well-nourished. She appears distressed.  HENT:  Head: Normocephalic and atraumatic.  Right Ear: External ear normal.  Left Ear: External ear normal.  Nose: Nose normal.  Mouth/Throat: Oropharynx is clear and moist.  Eyes: Conjunctivae and EOM are normal. Pupils are equal, round, and reactive to light.  Neck: Normal range of motion. Neck supple. No JVD present. No tracheal deviation present. No thyromegaly present.  Cardiovascular: Normal rate, regular rhythm, normal heart sounds and intact distal pulses.  Exam reveals no gallop and no friction rub.   No murmur heard. Pulmonary/Chest: Effort normal and breath sounds normal. No stridor. No respiratory distress. She has no wheezes. She has no rales. She exhibits  no tenderness.  Abdominal: Soft. Bowel sounds are normal. She exhibits no distension and no mass. There is no tenderness. There is no rebound and no guarding.  Musculoskeletal: Normal range of motion. She exhibits no edema and no tenderness.  Lymphadenopathy:    She has no cervical adenopathy.  Neurological: She is alert and oriented to person, place, and time. She has normal reflexes. She displays normal reflexes. No cranial nerve deficit. She exhibits normal muscle tone. Coordination (diffuse tremors in all extremities. Tremors present with movement but resolve with distraction, or gentle pressure on the extremity) abnormal.  Skin: Skin is warm and dry. No rash noted. No erythema. No pallor.  Psychiatric: She has a normal mood and affect. Her behavior is normal. Judgment and thought content  normal.  Anxious appearing    ED Course  Procedures (including critical care time)  Labs Reviewed  BASIC METABOLIC PANEL - Abnormal; Notable for the following:    Glucose, Bld 123 (*)    All other components within normal limits  CBC WITH DIFFERENTIAL  TROPONIN I   Ct Head Wo Contrast  12/05/2012  *RADIOLOGY REPORT*  Clinical Data: Headache, tremors  CT HEAD WITHOUT CONTRAST  Technique:  Contiguous axial images were obtained from the base of the skull through the vertex without contrast.  Comparison: 08/25/2010  Findings: The There is no evidence for acute hemorrhage, hydrocephalus, mass lesion, or abnormal extra-axial fluid collection.  No definite CT evidence for acute infarction.  The visualized paranasal sinuses and mastoid air cells are predominately clear.  IMPRESSION: No acute intracranial abnormality.   Original Report Authenticated By: Jearld Lesch, M.D.     Date: 12/05/2012  Rate: 99  Rhythm: normal sinus rhythm  QRS Axis: normal  Intervals: normal  ST/T Wave abnormalities: normal  Conduction Disutrbances:none  Narrative Interpretation:   Old EKG Reviewed: none available    1. Tremor of unknown origin       MDM  57 yo female with tremors.  She and husband report elevated BP at home on machine and with EMS machine, but suspect artifact due to her tremors.  Workup here unremarkable, full resolution after ativan.  Unclear etiology, will refer to neuro and pcm.  Possible due to recent wellbutrin cessation, but since she was only on it for 2 weeks, this seems less likely.          Olivia Mackie, MD 12/05/12 [phone]

## 2012-12-05 NOTE — ED Notes (Addendum)
Per EMS, pt having tremors. Hx of same when BP dropped. Pt. Also c/o nausea and mild chest pain initially, 4 zofran given. No pain currently. Recently had 2 medication changes.

## 2012-12-12 ENCOUNTER — Encounter: Payer: Self-pay | Admitting: Neurology

## 2012-12-12 ENCOUNTER — Ambulatory Visit (INDEPENDENT_AMBULATORY_CARE_PROVIDER_SITE_OTHER): Payer: BC Managed Care – PPO | Admitting: Neurology

## 2012-12-12 VITALS — BP 126/76 | HR 85 | Temp 98.2°F | Ht 64.5 in | Wt 216.0 lb

## 2012-12-12 DIAGNOSIS — R259 Unspecified abnormal involuntary movements: Secondary | ICD-10-CM

## 2012-12-12 DIAGNOSIS — R251 Tremor, unspecified: Secondary | ICD-10-CM

## 2012-12-12 HISTORY — DX: Tremor, unspecified: R25.1

## 2012-12-12 NOTE — Progress Notes (Signed)
Subjective:    Patient ID: Lori Lester is a 57 y.o. female.  HPI  Lori Foley, MD, PhD [facility] [address] [address] [place], [place] [address]  Dear Dr. Cliffton Lester and Lori Lester,   I saw your patient, Lori Lester, upon your kind request in my neurologic clinic today for initial consultation of her tremors. The patient is unaccompanied today. As you know, Lori Lester is a very pleasant 57 year old left-handed woman with an underlying medical history of chronic back pain, arthritis, anxiety, irritable bowel syndrome, chronic low back pain, hyperlipidemia, hypertension, headaches, reflux disease, and diverticular disease, who has been experiencing a bilateral upper extremity tremor for the past few weeks. She presented to the emergency room on 12/05/2012 with a presenting complaint of diffuse body tremors as well as palpitations. This was fairly acute in onset associated with a right posterior headache which was intermittent. She denied any one-sided weakness, numbness, tingling, facial droop, slurring of speech, hearing loss, tinnitus. She does report that her father has tremors. She had a head CT on 12/05/2012 and I reviewed the report which showed no acute intracranial abnormalities. Her tremors improved after she was given Ativan in the emergency room. Lab results for review at which included a unremarkable CBC, and unremarkable BMP, troponin was negative, EKG was also done and reviewed. She is currently on Exforge and she was taken off Wellbutrin, meclizine, Lamisil, atorvastatin, Nexium, lorazepam about a week ago, but so far, it has not made a difference in her tremor. She has a resting tremor and action tremor and postural tremor, but has not had difficulty with her ADLs and has noted some tremulousness in her handwriting. She has had some blood work on 11/04/12 which I reviewed. She had CBC, CMP, TSH, FSH. As I understand, she has another appointment  tomorrow a neurologist for additional blood work off of most of her medications. She is worried about her fluctuating blood pressure values. She denies any additional stressors. 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 Her husband works out of state and is only home once a week. Her father lives with them but she indicates no additional new stressors. She indicates that she has been going through menopausal symptoms. Her father has a tremor that also affects his head. She is not sure what he has been diagnosed with.  Her Past Medical History Is Significant For: Past Medical History  Diagnosis Date  . Hypertension   . Diverticulitis     Her Past Surgical History Is Significant For: Past Surgical History  Procedure Laterality Date  . Abdominal hysterectomy    . Back surgery      Her Family History Is Significant For: No family history on file.  Her Social History Is Significant For: History   Social History  . Marital Status: Married    Spouse Name: N/A    Number of Children: N/A  . Years of Education: N/A   Social History Main Topics  . Smoking status: Never Smoker   . Smokeless tobacco: None  . Alcohol Use: No  . Drug Use: No  . Sexually Active: None   Other Topics Concern  . None   Social History Narrative  . None    Her Allergies Are:  Allergies  Allergen Reactions  . Codeine   . Sulfa Antibiotics   :   Her Current Medications Are:  Outpatient Encounter Prescriptions as of  12/12/2012  Medication Sig Dispense Refill  . amLODipine-valsartan (EXFORGE) 10-320 MG per tablet Take 1 tablet by mouth daily.      Marland Kitchen atorvastatin (LIPITOR) 10 MG tablet Take 10 mg by mouth daily.      Marland Kitchen buPROPion (WELLBUTRIN XL) 150 MG 24 hr tablet Take 150 mg by mouth daily.      Marland Kitchen dexlansoprazole (DEXILANT) 60 MG capsule Take 60 mg by mouth daily.      Marland Kitchen LORazepam (ATIVAN) 1 MG tablet Take 1 tablet (1 mg total) by mouth 3 (three) times  daily as needed (tremor).  15 tablet  0  . Pancrelipase, Lip-Prot-Amyl, 24000 UNITS CPEP Take 1 capsule by mouth 2 (two) times daily with a meal.       No facility-administered encounter medications on file as of 12/12/2012.  :   Review of Systems  Constitutional: Positive for appetite change (loss of appetite) and fatigue.  Eyes: Positive for visual disturbance (blurred vision).  Cardiovascular: Positive for palpitations.  Gastrointestinal: Positive for constipation.       Diarrhea  Endocrine: Positive for heat intolerance.  Musculoskeletal: Positive for arthralgias.  Neurological: Positive for dizziness, tremors (Arms and hands), weakness and headaches.    Objective:  Neurologic Exam  Physical Exam Physical Examination:   Filed Vitals:   12/12/12 1023  BP: 126/76  Pulse: 85  Temp: 98.2 F (36.8 C)    General Examination: The patient is a very pleasant 57 y.o. female in no acute distress. She appears well-developed and well-nourished and well groomed.   HEENT: Normocephalic, atraumatic, pupils are equal, round and reactive to light and accommodation. Funduscopic exam is normal with sharp disc margins noted. Extraocular tracking is good without limitation to gaze excursion or nystagmus noted. Normal smooth pursuit is noted. Hearing is grossly intact. Tympanic membranes are clear bilaterally. Face is symmetric with normal facial animation and normal facial sensation. Speech is clear with no dysarthria noted. There is no hypophonia. There is no lip, neck or jaw tremor. Neck is supple with full range of motion. There are no carotid bruits on auscultation. Oropharynx exam reveals: adequate dental hygiene and mild airway crowding, due to narrow airway entry. Mallampati is class II. Tongue protrudes centrally and palate elevates symmetrically.    Chest: Clear to auscultation without wheezing, rhonchi or crackles noted.  Heart: S1+S2+0, regular and normal without murmurs, rubs or  gallops noted.   Abdomen: Soft, non-tender and non-distended with normal bowel sounds appreciated on auscultation.  Extremities: There is no pitting edema in the distal lower extremities bilaterally. Pedal pulses are intact.  Skin: Warm and dry without trophic changes noted. There are no varicose veins.  Musculoskeletal: exam reveals no obvious joint deformities, tenderness or joint swelling or erythema.   Neurologically:  Mental status: The patient is awake, alert and oriented in all 4 spheres. Her memory, attention, language and knowledge are appropriate. There is no aphasia, agnosia, apraxia or anomia. Speech is clear with normal prosody and enunciation. Thought process is linear. Mood is congruent and affect is normal.  Cranial nerves are as described above under HEENT exam. In addition, shoulder shrug is normal with equal shoulder height noted. Motor exam: Normal bulk, strength and tone is noted. There is no drift, or rebound. She has an intermittent resting, postural and action tremor in both upper extremities. The tremor is variable in amplitude and frequency. On Archimedes spiral drawing she has a coarse tremor bilaterally. Her handwriting is not micrographic and legible but with coarse trembling.  Her tremor is somewhat distractible. He does have a somewhat deliberate character at times. For example when walking she has a more turning type tremor, but the tremor seems to subside with contralateral activation, especially if she is having out a rhythm. Her upper extremity tremor improves when she is tapping out of rhythm with her foot on each side. Fine motor skills including finger taps and hand movements and rapid alternating patting are not in keeping with a decrement in amplitude. Foot agility and foot taps are fairly normal bilaterally. Romberg is negative. Reflexes are 2+ throughout.  Cerebellar testing shows no dysmetria or intention tremor on finger to nose testing. There is no truncal or  gait ataxia.  Sensory exam is intact to light touch. Gait, station and balance are unremarkable. No veering to one side is noted. No leaning to one side is noted. Posture is age-appropriate and stance is narrow based. No problems turning are noted. She turns en bloc. Intact toe and heel stance is noted.               Assessment and Plan:   Assessment and Plan:  In summary, Lori Lester is a very pleasant 57 y.o.-year old female with a history of New onset bilateral upper extremity tremor, which was fairly abrupt in onset. Her exam is nonfocal otherwise. Her tremor is not typical of a parkinsonian tremor and that she has a significant action and postural component. She has no changes in tone are fine motor skills otherwise. This is not typical of a drug-induced tremor in either because it is fairly variable in amplitude and frequency. I asked her again about potential stressors as there is a somewhat deliberate character and some distractibility. At this juncture, I would like to follow her clinically. I explained to her that I do not see any parkinsonian signs at this juncture and reassured her in that regard. This is unlikely a familial or essential tremor as it was rather abrupt in onset and has a resting component.  since he has an otherwise nonfocal exam and her recent normal head CT I did not add any further imaging studies but will consider MRI down the Road. I did not suggest any new medication that we can resort to trying symptomatic medication in the near future. I did not add any blood work as she had thyroid function tests and liver function and electrolytes last time with you when she is due for more blood work in [facility], systems. I think watchful waiting is indicated and I would like to reevaluate her in 3 months, sooner the need arises. In the interim if she has any questions, concerns, problems or updates she is encouraged to call. She and her husband were in agreement. Thank you  very much for allowing me to participate in the care of this nice patient. If I can be of any further assistance to you please do not hesitate to call me at [phone].  Sincerely,   Lori Foley, MD, PhD

## 2012-12-12 NOTE — Patient Instructions (Addendum)
I think overall you are doing fairly well but I do want to suggest a few things today:   I will consider a brain MRI down the road.   Remember to drink plenty of fluid, eat healthy meals and do not skip any meals. Try to eat protein with a every meal and eat a healthy snack such as fruit or nuts in between meals. Try to keep a regular sleep-wake schedule and try to exercise daily, particularly in the form of walking, 20-30 minutes a day, if you can.   As far as your medications are concerned, I would like to suggest no changes, but we may consider symptomatic treatment next time.   I would like to see you back in 3 months, sooner if we need to. Please call us with any interim questions, concerns, problems, updates or refill requests.  Brett Canales is my clinical assistant and will answer any of your questions and relay your messages to me and also relay most of my messages to you.  Our phone number is [phone]. We also have an after hours call service for urgent matters and there is a physician on-call for urgent questions. For any emergencies you know to call 911 or go to the nearest emergency room.

## 2013-03-09 ENCOUNTER — Telehealth: Payer: Self-pay | Admitting: Neurology

## 2013-03-13 ENCOUNTER — Ambulatory Visit: Payer: BC Managed Care – PPO | Admitting: Neurology

## 2013-10-04 ENCOUNTER — Other Ambulatory Visit: Payer: Self-pay

## 2013-10-04 DIAGNOSIS — Z1231 Encounter for screening mammogram for malignant neoplasm of breast: Secondary | ICD-10-CM

## 2013-10-26 NOTE — Telephone Encounter (Signed)
Pt never c/b to r/s, closing encounter

## 2013-11-08 ENCOUNTER — Ambulatory Visit
Admission: RE | Admit: 2013-11-08 | Discharge: 2013-11-08 | Disposition: A | Discharge status: Home / Self Care | Payer: PRIVATE HEALTH INSURANCE | Source: Ambulatory Visit

## 2013-11-08 DIAGNOSIS — Z1231 Encounter for screening mammogram for malignant neoplasm of breast: Secondary | ICD-10-CM

## 2014-09-20 ENCOUNTER — Other Ambulatory Visit: Payer: Self-pay | Admitting: Gastroenterology

## 2014-09-20 DIAGNOSIS — R1011 Right upper quadrant pain: Secondary | ICD-10-CM

## 2014-09-25 ENCOUNTER — Ambulatory Visit
Admission: RE | Admit: 2014-09-25 | Discharge: 2014-09-25 | Disposition: A | Discharge status: Home / Self Care | Payer: PRIVATE HEALTH INSURANCE | Source: Ambulatory Visit | Attending: Gastroenterology | Admitting: Gastroenterology

## 2014-09-25 DIAGNOSIS — R1011 Right upper quadrant pain: Secondary | ICD-10-CM

## 2014-11-29 ENCOUNTER — Other Ambulatory Visit: Payer: Self-pay

## 2014-11-29 DIAGNOSIS — Z1231 Encounter for screening mammogram for malignant neoplasm of breast: Secondary | ICD-10-CM

## 2014-12-03 ENCOUNTER — Ambulatory Visit
Admission: RE | Admit: 2014-12-03 | Discharge: 2014-12-03 | Disposition: A | Discharge status: Home / Self Care | Payer: PRIVATE HEALTH INSURANCE | Source: Ambulatory Visit

## 2014-12-03 DIAGNOSIS — Z1231 Encounter for screening mammogram for malignant neoplasm of breast: Secondary | ICD-10-CM

## 2015-11-04 ENCOUNTER — Other Ambulatory Visit: Payer: Self-pay | Admitting: Gastroenterology

## 2015-11-04 DIAGNOSIS — R110 Nausea: Secondary | ICD-10-CM

## 2015-11-04 DIAGNOSIS — R1011 Right upper quadrant pain: Secondary | ICD-10-CM

## 2015-11-04 DIAGNOSIS — R634 Abnormal weight loss: Secondary | ICD-10-CM

## 2015-11-11 ENCOUNTER — Ambulatory Visit
Admission: RE | Admit: 2015-11-11 | Discharge: 2015-11-11 | Disposition: A | Discharge status: Home / Self Care | Payer: BLUE CROSS/BLUE SHIELD | Source: Ambulatory Visit | Attending: Gastroenterology | Admitting: Gastroenterology

## 2015-11-11 DIAGNOSIS — R110 Nausea: Secondary | ICD-10-CM

## 2015-11-11 DIAGNOSIS — R1011 Right upper quadrant pain: Secondary | ICD-10-CM

## 2015-11-11 DIAGNOSIS — R634 Abnormal weight loss: Secondary | ICD-10-CM

## 2015-11-11 MED ORDER — IOPAMIDOL (ISOVUE-300) INJECTION 61%
100.0000 mL | Freq: Once | INTRAVENOUS | Status: AC | PRN
  Administered 2015-11-11: 100 mL via INTRAVENOUS

## 2015-11-12 ENCOUNTER — Other Ambulatory Visit (HOSPITAL_COMMUNITY): Payer: Self-pay | Admitting: Gastroenterology

## 2015-11-12 DIAGNOSIS — R1011 Right upper quadrant pain: Secondary | ICD-10-CM

## 2015-11-12 DIAGNOSIS — R110 Nausea: Secondary | ICD-10-CM

## 2015-11-26 ENCOUNTER — Ambulatory Visit (HOSPITAL_COMMUNITY): Payer: BLUE CROSS/BLUE SHIELD

## 2015-12-01 ENCOUNTER — Emergency Department (HOSPITAL_COMMUNITY): Payer: BLUE CROSS/BLUE SHIELD

## 2015-12-01 ENCOUNTER — Encounter (HOSPITAL_COMMUNITY): Payer: Self-pay | Admitting: Emergency Medicine

## 2015-12-01 ENCOUNTER — Observation Stay (HOSPITAL_COMMUNITY)
Admission: EM | Admit: 2015-12-01 | Discharge: 2015-12-03 | Disposition: A | Discharge status: Home / Self Care | Payer: BLUE CROSS/BLUE SHIELD | Attending: Family Medicine | Admitting: Family Medicine

## 2015-12-01 DIAGNOSIS — Z8249 Family history of ischemic heart disease and other diseases of the circulatory system: Secondary | ICD-10-CM | POA: Diagnosis not present

## 2015-12-01 DIAGNOSIS — F411 Generalized anxiety disorder: Secondary | ICD-10-CM | POA: Diagnosis not present

## 2015-12-01 DIAGNOSIS — F418 Other specified anxiety disorders: Secondary | ICD-10-CM

## 2015-12-01 DIAGNOSIS — R079 Chest pain, unspecified: Principal | ICD-10-CM | POA: Insufficient documentation

## 2015-12-01 DIAGNOSIS — F17210 Nicotine dependence, cigarettes, uncomplicated: Secondary | ICD-10-CM | POA: Insufficient documentation

## 2015-12-01 DIAGNOSIS — K219 Gastro-esophageal reflux disease without esophagitis: Secondary | ICD-10-CM | POA: Diagnosis not present

## 2015-12-01 DIAGNOSIS — I10 Essential (primary) hypertension: Secondary | ICD-10-CM | POA: Insufficient documentation

## 2015-12-01 DIAGNOSIS — R002 Palpitations: Secondary | ICD-10-CM | POA: Insufficient documentation

## 2015-12-01 DIAGNOSIS — E785 Hyperlipidemia, unspecified: Secondary | ICD-10-CM | POA: Diagnosis not present

## 2015-12-01 DIAGNOSIS — R072 Precordial pain: Secondary | ICD-10-CM | POA: Insufficient documentation

## 2015-12-01 DIAGNOSIS — Z720 Tobacco use: Secondary | ICD-10-CM | POA: Diagnosis present

## 2015-12-01 HISTORY — DX: Tobacco use: Z72.0

## 2015-12-01 LAB — COMPREHENSIVE METABOLIC PANEL
ALT: 18 U/L (ref 14–54)
AST: 21 U/L (ref 15–41)
Albumin: 3.6 g/dL (ref 3.5–5.0)
Alkaline Phosphatase: 70 U/L (ref 38–126)
Anion gap: 14 (ref 5–15)
BUN: 12 mg/dL (ref 6–20)
CHLORIDE: 107 mmol/L (ref 101–111)
CO2: 20 mmol/L — AB (ref 22–32)
CREATININE: 0.81 mg/dL (ref 0.44–1.00)
Calcium: 9 mg/dL (ref 8.9–10.3)
Glucose, Bld: 126 mg/dL — ABNORMAL HIGH (ref 65–99)
POTASSIUM: 3.9 mmol/L (ref 3.5–5.1)
SODIUM: 141 mmol/L (ref 135–145)
Total Bilirubin: 0.6 mg/dL (ref 0.3–1.2)
Total Protein: 6.8 g/dL (ref 6.5–8.1)

## 2015-12-01 LAB — CBC
HCT: 40 % (ref 36.0–46.0)
Hemoglobin: 12.9 g/dL (ref 12.0–15.0)
MCH: 29.6 pg (ref 26.0–34.0)
MCHC: 32.3 g/dL (ref 30.0–36.0)
MCV: 91.7 fL (ref 78.0–100.0)
PLATELETS: 202 10*3/uL (ref 150–400)
RBC: 4.36 MIL/uL (ref 3.87–5.11)
RDW: 13.9 % (ref 11.5–15.5)
WBC: 7.9 10*3/uL (ref 4.0–10.5)

## 2015-12-01 LAB — TROPONIN I

## 2015-12-01 LAB — PROTIME-INR
INR: 1.03 (ref 0.00–1.49)
PROTHROMBIN TIME: 13.7 s (ref 11.6–15.2)

## 2015-12-01 LAB — MAGNESIUM: MAGNESIUM: 2 mg/dL (ref 1.7–2.4)

## 2015-12-01 MED ORDER — ASPIRIN 81 MG PO CHEW
324.0000 mg | CHEWABLE_TABLET | Freq: Once | ORAL | Status: DC
  Filled 2015-12-01: qty 4

## 2015-12-01 MED ORDER — ONDANSETRON HCL 4 MG/2ML IJ SOLN
4.0000 mg | Freq: Once | INTRAMUSCULAR | Status: AC
  Administered 2015-12-01: 4 mg via INTRAVENOUS
  Filled 2015-12-01 (×2): qty 2

## 2015-12-01 MED ORDER — ONDANSETRON HCL 4 MG/2ML IJ SOLN
4.0000 mg | Freq: Once | INTRAMUSCULAR | Status: AC
  Administered 2015-12-02: 4 mg via INTRAVENOUS

## 2015-12-01 MED ORDER — LORAZEPAM 1 MG PO TABS
1.0000 mg | ORAL_TABLET | Freq: Once | ORAL | Status: AC
  Administered 2015-12-01: 1 mg via ORAL
  Filled 2015-12-01: qty 1

## 2015-12-01 NOTE — ED Provider Notes (Signed)
CSN: [id number]     Arrival date & time 12/01/15  2154 History   First MD Initiated Contact with Patient 12/01/15 2213     Chief Complaint  Patient presents with  . Chest Pain     (Consider location/radiation/quality/duration/timing/severity/associated sxs/prior Treatment) HPI 60 y.o. female with a history of hypertension, anxiety, and a history of intermittent palpitations for the last approximately 6-8 months presents to the emergency department noting progressively worsening symptoms now with left-sided chest pain. He states that her symptoms usually come on with a sensation of fluttering palpitations in her throat and chest, which then precipitates significant anxiety and then left-sided pressure-like chest pain rated as 5-6 out of 10. This pain is associated with nausea but no associated lightheadedness, diaphoresis or radiation. She denies any chest pain with exertion outside of episodic palpitations. He denies any history of a definitive diagnosis of paroxysmal atrial fibrillation or SVT. He states that though she does become significantly anxious during these episodes her palpitations occur spontaneously and then precipitate anxiety and she denies any palpitations precipitated by anxiety. Denies any history of prior ACS/MI.   Past Medical History  Diagnosis Date  . Hypertension   . Diverticulitis   . Tremor 12/12/2012  . Tobacco abuse    Past Surgical History  Procedure Laterality Date  . Abdominal hysterectomy    . Back surgery     No family history on file. Social History  Substance Use Topics  . Smoking status: Current Every Day Smoker -- 1.00 packs/day    Types: Cigarettes  . Smokeless tobacco: None  . Alcohol Use: No   OB History    No data available     Review of Systems  Constitutional: Positive for activity change. Negative for fever, chills and diaphoresis.  HENT: Negative for congestion, rhinorrhea, sinus pressure and sneezing.   Respiratory: Positive for  chest tightness and shortness of breath. Negative for cough.   Cardiovascular: Positive for chest pain and palpitations. Negative for leg swelling.  Gastrointestinal: Negative for nausea, vomiting and abdominal pain.  Musculoskeletal: Negative for back pain and neck pain.  Skin: Negative for rash.  Neurological: Negative for dizziness, syncope, weakness, light-headedness and headaches.  Psychiatric/Behavioral: The patient is nervous/anxious.   All other systems reviewed and are negative.     Allergies  Codeine and Sulfa antibiotics  Home Medications   Prior to Admission medications   Medication Sig Start Date End Date Taking? Authorizing Provider  LORazepam (ATIVAN) 1 MG tablet Take 1 tablet (1 mg total) by mouth 3 (three) times daily as needed (tremor). 12/05/12  Yes Marisa Severinlga Otter, MD  omeprazole (PRILOSEC) 40 MG capsule Take 40 mg by mouth daily.   Yes Historical Provider, MD  Probiotic Product (PROBIOTIC DAILY PO) Take 1 tablet by mouth every other day.   Yes Historical Provider, MD   BP 143/84 mmHg  Pulse 102  Temp(Src) 98.4 F (36.9 C) (Oral)  Resp 18  SpO2 94% Physical Exam  Constitutional: She is oriented to person, place, and time. She appears well-developed and well-nourished. No distress.  HENT:  Head: Normocephalic and atraumatic.  Nose: Nose normal.  Mouth/Throat: Oropharynx is clear and moist.  Eyes: Conjunctivae and EOM are normal. Pupils are equal, round, and reactive to light.  Neck: Neck supple.  Cardiovascular: Normal rate, regular rhythm, normal heart sounds and intact distal pulses.   Pulmonary/Chest: Effort normal and breath sounds normal. She exhibits no tenderness.  Abdominal: Soft. There is no tenderness.  Musculoskeletal: She exhibits no  edema or tenderness.  Neurological: She is alert and oriented to person, place, and time. No cranial nerve deficit. Coordination normal.  Skin: Skin is warm and dry. She is not diaphoretic.  Psychiatric: Her mood  appears anxious.  Tremulous in her b/l UE.  Nursing note and vitals reviewed.   ED Course  Procedures (including critical care time) Labs Review Labs Reviewed  COMPREHENSIVE METABOLIC PANEL - Abnormal; Notable for the following:    CO2 20 (*)    Glucose, Bld 126 (*)    All other components within normal limits  CBC  MAGNESIUM  PROTIME-INR  TROPONIN I  TSH  TROPONIN I  TROPONIN I  TROPONIN I  CBC WITH DIFFERENTIAL/PLATELET  BASIC METABOLIC PANEL  HEMOGLOBIN A1C  LIPID PANEL    Imaging Review Dg Chest 2 View  12/01/2015  CLINICAL DATA:  Left-sided chest pain.  Onset today. EXAM: CHEST  2 VIEW COMPARISON:  01/02/2009 FINDINGS: The cardiomediastinal contours are normal. The lungs are clear. Pulmonary vasculature is normal. No consolidation, pleural effusion, or pneumothorax. No acute osseous abnormalities are seen. IMPRESSION: No acute pulmonary process. Electronically Signed   By: Rubye Oaks M.D.   On: 12/01/2015 23:50   I have personally reviewed and evaluated these images and lab results as part of my medical decision-making.   EKG Interpretation   Date/Time:  Sunday December 01 2015 22:07:59 EDT Ventricular Rate:  104 PR Interval:  126 QRS Duration: 80 QT Interval:  348 QTC Calculation: 457 R Axis:   -9 Text Interpretation:  Accelerated Junctional rhythm Inferior infarct , age  undetermined Abnormal ECG Sinus tachycardia Artifact Abnormal ekg  Confirmed by Gerhard Munch  MD [phone]) on 12/01/2015 10:12:48 PM      MDM  60 y.o. with a history of several months of intermittent palpitations, gradually worsening with associated left sided chest pain, anxiety, and nausea. Physical exam significant for obvious anxiety with marked b/l UE tremors. Initial EKG was done and was difficult to interpret due to wavering baseline because of tremors, but appears to show sinus tach with no clear evidence of ischemia, rate just over 100. Labs were drawn and returned reassuringly  with negative troponin, no leukocytosis, anemia or significant electrolyte abnormality. CXR was negative. The patient had improvement in her sx after ativan and ASA with rates going down to the 80's. Given her episodic palpitations history feel that the patient should be admitted for observation on tele for further evaluation of her symptoms and to pursue cardiac stress testing given associated left sided chest pain. Feel that her sx may very likely be associated with significant anxiety disorder, but feel that her sx warrant further workup to role out an ACS/arrhythmia etiology for them. This plan was discussed with the patient at the bedside and she stated both understanding and agreement. She was then admitted to the hospitalist for further care HDS in NAD.     Final diagnoses:  Intermittent palpitations  Chest pain, unspecified chest pain type  Anxiety about health       Francoise Ceo, DO 12/02/15 0120  Gerhard Munch, MD 12/03/15 [phone]

## 2015-12-01 NOTE — ED Notes (Signed)
Pt arrives via EMS from home with c/o CP starting today, pain is L sided and radiates to midback. 324MG  ASA on board, refused nitro. 4 MG zofran on board.

## 2015-12-02 ENCOUNTER — Observation Stay (HOSPITAL_BASED_OUTPATIENT_CLINIC_OR_DEPARTMENT_OTHER): Payer: BLUE CROSS/BLUE SHIELD

## 2015-12-02 ENCOUNTER — Encounter (HOSPITAL_COMMUNITY): Payer: Self-pay | Admitting: Internal Medicine

## 2015-12-02 DIAGNOSIS — R002 Palpitations: Secondary | ICD-10-CM | POA: Diagnosis not present

## 2015-12-02 DIAGNOSIS — R079 Chest pain, unspecified: Secondary | ICD-10-CM

## 2015-12-02 DIAGNOSIS — E785 Hyperlipidemia, unspecified: Secondary | ICD-10-CM

## 2015-12-02 DIAGNOSIS — Z720 Tobacco use: Secondary | ICD-10-CM

## 2015-12-02 DIAGNOSIS — F411 Generalized anxiety disorder: Secondary | ICD-10-CM

## 2015-12-02 DIAGNOSIS — R072 Precordial pain: Secondary | ICD-10-CM | POA: Insufficient documentation

## 2015-12-02 DIAGNOSIS — K219 Gastro-esophageal reflux disease without esophagitis: Secondary | ICD-10-CM | POA: Diagnosis not present

## 2015-12-02 DIAGNOSIS — Z8249 Family history of ischemic heart disease and other diseases of the circulatory system: Secondary | ICD-10-CM | POA: Diagnosis not present

## 2015-12-02 LAB — TSH: TSH: 0.519 u[IU]/mL (ref 0.350–4.500)

## 2015-12-02 LAB — TROPONIN I: Troponin I: <0.03 ng/mL (ref <0.031)

## 2015-12-02 LAB — CBC WITH DIFFERENTIAL/PLATELET
Basophils Absolute: 0 10*3/uL (ref 0.0–0.1)
Basophils Relative: 0 %
Eosinophils Absolute: 0 10*3/uL (ref 0.0–0.7)
Eosinophils Relative: 0 %
HEMATOCRIT: 36.9 % (ref 36.0–46.0)
HEMOGLOBIN: 12 g/dL (ref 12.0–15.0)
LYMPHS ABS: 1.7 10*3/uL (ref 0.7–4.0)
LYMPHS PCT: 25 %
MCH: 29.7 pg (ref 26.0–34.0)
MCHC: 32.5 g/dL (ref 30.0–36.0)
MCV: 91.3 fL (ref 78.0–100.0)
MONO ABS: 0.4 10*3/uL (ref 0.1–1.0)
MONOS PCT: 6 %
NEUTROS ABS: 4.7 10*3/uL (ref 1.7–7.7)
NEUTROS PCT: 69 %
Platelets: 176 10*3/uL (ref 150–400)
RBC: 4.04 MIL/uL (ref 3.87–5.11)
RDW: 14 % (ref 11.5–15.5)
WBC: 6.8 10*3/uL (ref 4.0–10.5)

## 2015-12-02 LAB — BASIC METABOLIC PANEL
Anion gap: 10 (ref 5–15)
BUN: 10 mg/dL (ref 6–20)
CALCIUM: 8.8 mg/dL — AB (ref 8.9–10.3)
CHLORIDE: 107 mmol/L (ref 101–111)
CO2: 24 mmol/L (ref 22–32)
CREATININE: 0.78 mg/dL (ref 0.44–1.00)
GFR calc Af Amer: >60 mL/min (ref >60)
GFR calc non Af Amer: >60 mL/min (ref >60)
GLUCOSE: 108 mg/dL — AB (ref 65–99)
Potassium: 3.9 mmol/L (ref 3.5–5.1)
Sodium: 141 mmol/L (ref 135–145)

## 2015-12-02 LAB — ECHOCARDIOGRAM COMPLETE

## 2015-12-02 LAB — LIPID PANEL
Cholesterol: 211 mg/dL — ABNORMAL HIGH (ref 0–200)
HDL: 37 mg/dL — AB (ref >40)
LDL CALC: 151 mg/dL — AB (ref 0–99)
Total CHOL/HDL Ratio: 5.7 RATIO
Triglycerides: 117 mg/dL (ref <150)
VLDL: 23 mg/dL (ref 0–40)

## 2015-12-02 LAB — T4, FREE: FREE T4: 0.85 ng/dL (ref 0.61–1.12)

## 2015-12-02 MED ORDER — GI COCKTAIL ~~LOC~~: 30.0000 mL | Freq: Four times a day (QID) | ORAL | Status: DC | PRN

## 2015-12-02 MED ORDER — ASPIRIN 325 MG PO TABS: 325.0000 mg | ORAL_TABLET | Freq: Every day | ORAL | Status: DC

## 2015-12-02 MED ORDER — ENOXAPARIN SODIUM 40 MG/0.4ML ~~LOC~~ SOLN
40.0000 mg | SUBCUTANEOUS | Status: DC
  Administered 2015-12-02 – 2015-12-03 (×2): 40 mg via SUBCUTANEOUS
  Filled 2015-12-02 (×2): qty 0.4

## 2015-12-02 MED ORDER — ACETAMINOPHEN 325 MG PO TABS: 650.0000 mg | ORAL_TABLET | ORAL | Status: DC | PRN

## 2015-12-02 MED ORDER — ENSURE ENLIVE PO LIQD
237.0000 mL | Freq: Two times a day (BID) | ORAL | Status: DC
  Administered 2015-12-02: 237 mL via ORAL

## 2015-12-02 MED ORDER — ATORVASTATIN CALCIUM 80 MG PO TABS
80.0000 mg | ORAL_TABLET | Freq: Every day | ORAL | Status: DC
  Administered 2015-12-02: 80 mg via ORAL
  Filled 2015-12-02: qty 1

## 2015-12-02 MED ORDER — SODIUM CHLORIDE 0.9% FLUSH
3.0000 mL | Freq: Two times a day (BID) | INTRAVENOUS | Status: DC
  Administered 2015-12-02: 3 mL via INTRAVENOUS

## 2015-12-02 MED ORDER — FLORA-Q PO CAPS
ORAL_CAPSULE | ORAL | Status: DC
  Filled 2015-12-02: qty 1

## 2015-12-02 MED ORDER — SODIUM CHLORIDE 0.9 % IV SOLN: 250.0000 mL | INTRAVENOUS | Status: DC | PRN

## 2015-12-02 MED ORDER — LORAZEPAM 1 MG PO TABS
1.0000 mg | ORAL_TABLET | Freq: Three times a day (TID) | ORAL | Status: DC | PRN
  Administered 2015-12-02: 1 mg via ORAL
  Filled 2015-12-02: qty 1

## 2015-12-02 MED ORDER — NITROGLYCERIN 0.4 MG SL SUBL: 0.4000 mg | SUBLINGUAL_TABLET | SUBLINGUAL | Status: DC | PRN

## 2015-12-02 MED ORDER — ASPIRIN 325 MG PO TABS
325.0000 mg | ORAL_TABLET | Freq: Every day | ORAL | Status: DC
  Administered 2015-12-02: 325 mg via ORAL
  Filled 2015-12-02: qty 1

## 2015-12-02 MED ORDER — NICOTINE 21 MG/24HR TD PT24
21.0000 mg | MEDICATED_PATCH | Freq: Every day | TRANSDERMAL | Status: DC | PRN
  Administered 2015-12-02: 21 mg via TRANSDERMAL
  Filled 2015-12-02: qty 1

## 2015-12-02 MED ORDER — PANTOPRAZOLE SODIUM 40 MG PO TBEC
40.0000 mg | DELAYED_RELEASE_TABLET | Freq: Every day | ORAL | Status: DC
  Administered 2015-12-02: 40 mg via ORAL
  Filled 2015-12-02: qty 1

## 2015-12-02 MED ORDER — BOOST / RESOURCE BREEZE PO LIQD: 1.0000 | Freq: Two times a day (BID) | ORAL | Status: DC

## 2015-12-02 MED ORDER — SODIUM CHLORIDE 0.9 % WEIGHT BASED INFUSION
1.0000 mL/kg/h | INTRAVENOUS | Status: DC
  Administered 2015-12-02: 1 mL/kg/h via INTRAVENOUS

## 2015-12-02 MED ORDER — ADULT MULTIVITAMIN W/MINERALS CH
1.0000 | ORAL_TABLET | Freq: Every day | ORAL | Status: DC
Start: 2015-12-02 — End: 2015-12-03

## 2015-12-02 MED ORDER — SACCHAROMYCES BOULARDII 250 MG PO CAPS
250.0000 mg | ORAL_CAPSULE | Freq: Every day | ORAL | Status: DC
  Administered 2015-12-02: 250 mg via ORAL
  Filled 2015-12-02: qty 1

## 2015-12-02 MED ORDER — ASPIRIN 81 MG PO CHEW
81.0000 mg | CHEWABLE_TABLET | ORAL | Status: AC
Start: 2015-12-03 — End: 2015-12-03
  Administered 2015-12-03: 81 mg via ORAL
  Filled 2015-12-02: qty 1

## 2015-12-02 MED ORDER — ONDANSETRON HCL 4 MG/2ML IJ SOLN
4.0000 mg | Freq: Four times a day (QID) | INTRAMUSCULAR | Status: DC | PRN
  Administered 2015-12-02 (×4): 4 mg via INTRAVENOUS
  Filled 2015-12-02 (×4): qty 2

## 2015-12-02 MED ORDER — SODIUM CHLORIDE 0.9% FLUSH: 3.0000 mL | INTRAVENOUS | Status: DC | PRN

## 2015-12-02 MED ORDER — MORPHINE SULFATE (PF) 2 MG/ML IV SOLN: 2.0000 mg | INTRAVENOUS | Status: DC | PRN

## 2015-12-02 NOTE — Progress Notes (Signed)
3:03 PM I agree with HPI/GPe and A/P per Dr. Laurene FootmanSmith     HEENT alert tearful pleasant otherwise seems scared to have the procedure done tomorrow-for some reason thinks that she has cancer.  CHEST  Clinically clear no added soundCARDIAC ABDOMEN OBESE NONTENDER NONDISTENDED NO REBOUND OR GUARDING NO ORGANOMEGALY  NEURO MOVING ALL 4 LIMBS EQUALLY.   Patient Active Problem List   Diagnosis Date Noted  . Chest pain   Heart score 4. App cards input Cath am Sig psych overlay 12/02/2015    Priority: High  . Intermittent palpitations Anxiety IBS  OP management by psych cont Ativan 1 mg 3 times a day when necessary tremor Suspect may benefit also from addition of low-dose mood stabilizer-suggest outpatient initiation of Depakote/valproic acid-deferred to psychiatry     12/02/2015    Priority: Medium  . Anxiety state 12/02/2015    Priority: Medium  . Tremor Follow as an outpatient with neurologist-no defined pathology determined at present TSH is within normal limits Consider low-dose beta blocker?   12/12/2012    Priority: Medium  . GERD (gastroesophageal reflux disease) 12/02/2015  . Tobacco abuse Cessation is imperative Outpatient counseling needed   12/02/2015  . Hyperlipidemia 12/02/2015   Pleas KochJai Ninah Moccio, MD [facility] [phone]

## 2015-12-02 NOTE — Consult Note (Signed)
Cardiologist:  Harrington Challenger-  Not seen in 10 years  Reason for Consult:  Chest Pain Referring Physician:  Michaelia Beilfuss is an 60 y.o. female.  HPI:  Patient is a 60 year old obese female with history of hypertension, tobacco abuse(5py.  She started smoking 5 years ago), diverticulitis tremor. She had abdominal hysterectomy and has had back surgery.  She presented with chest pain and palpitations.  Patient states that the chest pressure started last night. Was 7 out of 10 in intensity and radiated to her left shoulder blade. She also had a funny feeling in her left hand. Blood pressure at home last night was 180/94. Heart rate of 98.   She continues to have 2 out of 10 chest pressure and has been nauseated and vomiting this morning only.  Past Medical History  Diagnosis Date  . Hypertension   . Diverticulitis   . Tremor 12/12/2012  . Tobacco abuse     Past Surgical History  Procedure Laterality Date  . Abdominal hysterectomy    . Back surgery      No family history on file.  Social History:  reports that she has been smoking Cigarettes.  She has been smoking about 1.00 pack per day. She does not have any smokeless tobacco history on file. She reports that she does not drink alcohol or use illicit drugs.  Allergies:  Allergies  Allergen Reactions  . Codeine Nausea And Vomiting  . Sulfa Antibiotics Nausea And Vomiting    Medications: Scheduled Meds: . aspirin  324 mg Oral Once  . aspirin  325 mg Oral Daily  . enoxaparin (LOVENOX) injection  40 mg Subcutaneous Q24H  . feeding supplement (ENSURE ENLIVE)  237 mL Oral BID BM  . pantoprazole  40 mg Oral Daily  . saccharomyces boulardii  250 mg Oral Daily   Continuous Infusions:  PRN Meds:.acetaminophen, gi cocktail, LORazepam, morphine injection, nicotine, nitroGLYCERIN, ondansetron (ZOFRAN) IV   Results for orders placed or performed during the hospital encounter of 12/01/15 (from the past 48 hour(s))  CBC      Status: None   Collection Time: 12/01/15 10:49 PM  Result Value Ref Range   WBC 7.9 4.0 - 10.5 K/uL   RBC 4.36 3.87 - 5.11 MIL/uL   Hemoglobin 12.9 12.0 - 15.0 g/dL   HCT 40.0 36.0 - 46.0 %   MCV 91.7 78.0 - 100.0 fL   MCH 29.6 26.0 - 34.0 pg   MCHC 32.3 30.0 - 36.0 g/dL   RDW 13.9 11.5 - 15.5 %   Platelets 202 150 - 400 K/uL  Comprehensive metabolic panel     Status: Abnormal   Collection Time: 12/01/15 10:49 PM  Result Value Ref Range   Sodium 141 135 - 145 mmol/L   Potassium 3.9 3.5 - 5.1 mmol/L   Chloride 107 101 - 111 mmol/L   CO2 20 (L) 22 - 32 mmol/L   Glucose, Bld 126 (H) 65 - 99 mg/dL   BUN 12 6 - 20 mg/dL   Creatinine, Ser 0.81 0.44 - 1.00 mg/dL   Calcium 9.0 8.9 - 10.3 mg/dL   Total Protein 6.8 6.5 - 8.1 g/dL   Albumin 3.6 3.5 - 5.0 g/dL   AST 21 15 - 41 U/L   ALT 18 14 - 54 U/L   Alkaline Phosphatase 70 38 - 126 U/L   Total Bilirubin 0.6 0.3 - 1.2 mg/dL   GFR calc non Af Amer >60 >60 mL/min   GFR calc  Af Amer >60 >60 mL/min    Comment: (NOTE) The eGFR has been calculated using the CKD EPI equation. This calculation has not been validated in all clinical situations. eGFR's persistently <60 mL/min signify possible Chronic Kidney Disease.    Anion gap 14 5 - 15  Magnesium     Status: None   Collection Time: 12/01/15 10:49 PM  Result Value Ref Range   Magnesium 2.0 1.7 - 2.4 mg/dL  Protime-INR     Status: None   Collection Time: 12/01/15 10:49 PM  Result Value Ref Range   Prothrombin Time 13.7 11.6 - 15.2 seconds   INR 1.03 0.00 - 1.49  Troponin I (order at [facility])     Status: None   Collection Time: 12/01/15 10:49 PM  Result Value Ref Range   Troponin I <0.03 <0.031 ng/mL    Comment:        NO INDICATION OF MYOCARDIAL INJURY.   Troponin I-serum (0, 3, 6 hours)     Status: None   Collection Time: 12/02/15  1:10 AM  Result Value Ref Range   Troponin I <0.03 <0.031 ng/mL    Comment:        NO INDICATION OF MYOCARDIAL INJURY.   TSH     Status: None    Collection Time: 12/02/15  4:15 AM  Result Value Ref Range   TSH 0.519 0.350 - 4.500 uIU/mL  Troponin I-serum (0, 3, 6 hours)     Status: None   Collection Time: 12/02/15  4:15 AM  Result Value Ref Range   Troponin I <0.03 <0.031 ng/mL    Comment:        NO INDICATION OF MYOCARDIAL INJURY.   CBC with Differential/Platelet     Status: None   Collection Time: 12/02/15  4:15 AM  Result Value Ref Range   WBC 6.8 4.0 - 10.5 K/uL   RBC 4.04 3.87 - 5.11 MIL/uL   Hemoglobin 12.0 12.0 - 15.0 g/dL   HCT 36.9 36.0 - 46.0 %   MCV 91.3 78.0 - 100.0 fL   MCH 29.7 26.0 - 34.0 pg   MCHC 32.5 30.0 - 36.0 g/dL   RDW 14.0 11.5 - 15.5 %   Platelets 176 150 - 400 K/uL   Neutrophils Relative % 69 %   Neutro Abs 4.7 1.7 - 7.7 K/uL   Lymphocytes Relative 25 %   Lymphs Abs 1.7 0.7 - 4.0 K/uL   Monocytes Relative 6 %   Monocytes Absolute 0.4 0.1 - 1.0 K/uL   Eosinophils Relative 0 %   Eosinophils Absolute 0.0 0.0 - 0.7 K/uL   Basophils Relative 0 %   Basophils Absolute 0.0 0.0 - 0.1 K/uL  Basic metabolic panel     Status: Abnormal   Collection Time: 12/02/15  4:15 AM  Result Value Ref Range   Sodium 141 135 - 145 mmol/L   Potassium 3.9 3.5 - 5.1 mmol/L   Chloride 107 101 - 111 mmol/L   CO2 24 22 - 32 mmol/L   Glucose, Bld 108 (H) 65 - 99 mg/dL   BUN 10 6 - 20 mg/dL   Creatinine, Ser 0.78 0.44 - 1.00 mg/dL   Calcium 8.8 (L) 8.9 - 10.3 mg/dL   GFR calc non Af Amer >60 >60 mL/min   GFR calc Af Amer >60 >60 mL/min    Comment: (NOTE) The eGFR has been calculated using the CKD EPI equation. This calculation has not been validated in all clinical situations. eGFR's persistently <60 mL/min  signify possible Chronic Kidney Disease.    Anion gap 10 5 - 15  Lipid panel     Status: Abnormal   Collection Time: 12/02/15  4:15 AM  Result Value Ref Range   Cholesterol 211 (H) 0 - 200 mg/dL   Triglycerides 117 <150 mg/dL   HDL 37 (L) >40 mg/dL   Total CHOL/HDL Ratio 5.7 RATIO   VLDL 23 0 - 40 mg/dL     LDL Cholesterol 151 (H) 0 - 99 mg/dL    Comment:        Total Cholesterol/HDL:CHD Risk Coronary Heart Disease Risk Table                     Men   Women  1/2 Average Risk   3.4   3.3  Average Risk       5.0   4.4  2 X Average Risk   9.6   7.1  3 X Average Risk  23.4   11.0        Use the calculated Patient Ratio above and the CHD Risk Table to determine the patient's CHD Risk.        ATP III CLASSIFICATION (LDL):  <100     mg/dL   Optimal  100-129  mg/dL   Near or Above                    Optimal  130-159  mg/dL   Borderline  160-189  mg/dL   High  >190     mg/dL   Very High   T4, free     Status: None   Collection Time: 12/02/15  4:15 AM  Result Value Ref Range   Free T4 0.85 0.61 - 1.12 ng/dL    Dg Chest 2 View  12/01/2015  CLINICAL DATA:  Left-sided chest pain.  Onset today. EXAM: CHEST  2 VIEW COMPARISON:  01/02/2009 FINDINGS: The cardiomediastinal contours are normal. The lungs are clear. Pulmonary vasculature is normal. No consolidation, pleural effusion, or pneumothorax. No acute osseous abnormalities are seen. IMPRESSION: No acute pulmonary process. Electronically Signed   By: Jeb Levering M.D.   On: 12/01/2015 23:50   Lipid Panel     Component Value Date/Time   CHOL 211* 12/02/2015 0415   TRIG 117 12/02/2015 0415   HDL 37* 12/02/2015 0415   CHOLHDL 5.7 12/02/2015 0415   VLDL 23 12/02/2015 0415   LDLCALC 151* 12/02/2015 0415     Review of Systems  Constitutional: Negative for fever and chills.  HENT: Negative for congestion and sore throat.   Respiratory: Negative for shortness of breath.   Cardiovascular: Positive for chest pain (radiating to shoulder blade). Negative for orthopnea, leg swelling and PND.  Gastrointestinal: Positive for nausea and vomiting. Negative for abdominal pain, constipation, blood in stool and melena.  Genitourinary: Negative for hematuria.  Musculoskeletal: Negative for myalgias.  Neurological: Positive for dizziness.  All  other systems reviewed and are negative.  Blood pressure 122/73, pulse 91, temperature 97.7 F (36.5 C), temperature source Oral, resp. rate 20, SpO2 88 %. Physical Exam  Nursing note and vitals reviewed. Constitutional: She is oriented to person, place, and time. She appears well-developed. No distress.  Obese  HENT:  Head: Normocephalic and atraumatic.  Eyes: Conjunctivae are normal. Pupils are equal, round, and reactive to light. No scleral icterus.  Neck: Normal range of motion. Neck supple. No JVD present.  Cardiovascular: Normal rate, regular rhythm, S1 normal and S2 normal.  No murmur heard. Pulses:      Radial pulses are 2+ on the right side, and 2+ on the left side.       Dorsalis pedis pulses are 2+ on the right side, and 2+ on the left side.  Respiratory: Effort normal and breath sounds normal. She has no wheezes. She has no rales. She exhibits tenderness (Chest tenderness up under her left breast however, not the same as the pressure she's feeling).  GI: Soft. Bowel sounds are normal. She exhibits no distension. There is tenderness (left upper quadrant).  Musculoskeletal: She exhibits no edema.  Lymphadenopathy:    She has no cervical adenopathy.  Neurological: She is alert and oriented to person, place, and time. She exhibits normal muscle tone.  Skin: Skin is warm and dry.  Psychiatric: She has a normal mood and affect.    Assessment/Plan: Principal Problem:   Chest pain Active Problems:   Intermittent palpitations   Anxiety state   GERD (gastroesophageal reflux disease)   Tobacco abuse   Hyperlipidemia   60 year old obese female with history of hypertension, tobacco abuse(5py. She started smoking 5 years ago), diverticulitis tremor. She had abdominal hysterectomy and has had back surgery.  She presented with chest pain radiating into her shoulder blade and palpitations.  It sounds like unstable angina.  She states that the chest pressure started last night.  She  ruled out for myocardial infarction. EKG shows minor inferior T-wave changes.   Although her symptoms could have another cause, such as GI or morning meds(nausea and vomiting), she has risk factors for coronary disease including obesity, hyperlipidemia-with an LDL of 151, tobacco abuse, family history (mom died of a heart attack at age 37).   We will need to decide on coronary angiography or stress test.  I would favor a LHC.  Add statin.  Dr. Oval Linsey to see.    Tarri Fuller, [place] 12/02/2015, 12:36 PM

## 2015-12-02 NOTE — Progress Notes (Signed)
Initial Nutrition Assessment  DOCUMENTATION CODES:   Obesity unspecified  INTERVENTION:  Provide Boost Breeze po BID, each supplement provides 250 kcal and 9 grams of protein Provide Multivitamin with minerals daily   NUTRITION DIAGNOSIS:   Predicted suboptimal nutrient intake related to poor appetite, nausea as evidenced by per patient/family report.   GOAL:   Patient will meet greater than or equal to 90% of their needs   MONITOR:   PO intake, Supplement acceptance, Skin, Labs, I & O's, Weight trends  REASON FOR ASSESSMENT:   Malnutrition Screening Tool    ASSESSMENT:   60 year old female with a past medical history significant for HTN, HLD, anxiety, and tremor; who presents with complaints of intermittent palpitations with chest pains. Patient states symptoms have been ongoing over the last 6 months.   Pt with nausea and vomiting at time of first visit this morning. Pt resting comfortably at time of second visit, denying nausea. Pt states that she has had a poor appetite for the past 5 months, eating only 2 boiled eggs at breakfast, peanut butter crackers for snack/lunch, and a small plate of food for dinner. She reports losing 14 lbs in the past 5 months, down to current weight of 189 lbs. She denies any symptoms of nausea, abdominal pain, or indigestion contributing to her poor appetite. She reports drinking Cola throughout the day, not any water. She states that her usual weight is 225 lbs.  RD emphasized the importance of nutrition with consistent intake throughout the day and intake of lean meats, low fat dairy, whole grains, fruits, and vegetables. Encouraged intake of water throughout the day. She continues to have a poor appetite and reports not eating any breakfast. She is agreeable to drinking Boost Breeze until appetite improves. Of note, pt ate 75% of breakfast per nursing notes.  Pt appears well-nourished.   Labs reviewed.   Diet Order:  Diet Heart Room service  appropriate?: Yes; Fluid consistency:: Thin Diet NPO time specified Except for: Sips with Meds  Skin:  Reviewed, no issues  Last BM:  unknown  Height:   Ht Readings from Last 1 Encounters:  12/02/15 5\' 5"  (1.651 m)    Weight:   Wt Readings from Last 1 Encounters:  12/02/15 189 lb (85.73 kg)    Ideal Body Weight:  56.8 kg  BMI:  Body mass index is 31.45 kg/(m^2).  Estimated Nutritional Needs:   Kcal:  1600-1800  Protein:  75-85 grams  Fluid:  >/= 1.9 L/day  EDUCATION NEEDS:   No education needs identified at this time  Dorothea Ogleeanne Jody Aguinaga RD, LDN Inpatient Clinical Dietitian Pager: [phone] After Hours Pager: [phone]

## 2015-12-02 NOTE — ED Notes (Signed)
Attempted report 

## 2015-12-02 NOTE — Progress Notes (Signed)
Echocardiogram 2D Echocardiogram has been performed.  Lori Lester, Lori Lester M 12/02/2015, 2:06 PM

## 2015-12-02 NOTE — H&P (Signed)
[facility] History and Physical  Lori Lester ZOX:[id number] DOB: Jan 20, 1956 DOA: 12/01/2015  Referring physician: ED PCP: Delphia Grates   Chief Complaint:   Heart fluttering and chest pain  HPI:  Lori Lester is a 60 year old female with a past medical history significant for HTN, HLD, anxiety, and tremor; who presents with complaints of intermittent palpitations with chest pains. Patient states symptoms have been ongoing over the last 6 months. Usually symptoms are very short lived, however her today symptoms or more frequent than usual with reports of more severe chest pain symptoms then passed. Reports a heaviness underneath the left breast. Associated symptoms include shortness of breath with ambulation, clamminess, and nausea with epsiodes. Also endorses generalized fatigue, decreased appetite, 20 pound weight loss in the last 5 months, urinary frequency, and back pain,  Previously evaluated with a walking stress test 7-8 years ago done at [facility] that was within normal limits. She cannot recall what was the reason for this at that time. She also notes a family history of her brother passing away from a heart attack at the age of 48 and her mother dying from a heart attack at age of 22.  Upon admission patient was evaluated and seen to slightly tachycardic with heart rate of 106. Initial troponin negative with EKG showing possible junctional rhythm versus sinus tachycardia.   Review of Systems  Constitutional: Positive for weight loss and malaise/fatigue. Negative for fever and chills.       Positive for decreased appetite  HENT: Negative for hearing loss and tinnitus.   Eyes: Negative for double vision and photophobia.  Respiratory: Negative for hemoptysis and shortness of breath.   Cardiovascular: Positive for chest pain, palpitations and leg swelling.  Gastrointestinal: Positive for nausea. Negative for vomiting, abdominal pain, diarrhea and constipation.   Genitourinary: Negative for urgency and frequency.  Musculoskeletal: Positive for back pain. Negative for falls.  Skin: Negative for itching and rash.  Neurological: Positive for tremors. Negative for focal weakness, seizures and loss of consciousness.       Positive for lightheadedness  Endo/Heme/Allergies: Negative for environmental allergies. Does not bruise/bleed easily.  Psychiatric/Behavioral: Negative for suicidal ideas and substance abuse. The patient is nervous/anxious.      Past Medical History  Diagnosis Date  . Hypertension   . Diverticulitis   . Tremor 12/12/2012     Past Surgical History  Procedure Laterality Date  . Abdominal hysterectomy    . Back surgery        Social History:  reports that she has been smoking Cigarettes.  She has been smoking about 1.00 pack per day. She does not have any smokeless tobacco history on file. She reports that she does not drink alcohol or use illicit drugs. Where does patient live--home and with whom if at home? husband Can patient participate in ADLs?Yes  Allergies  Allergen Reactions  . Codeine Nausea And Vomiting  . Sulfa Antibiotics Nausea And Vomiting    No family history on file.     Prior to Admission medications   Medication Sig Start Date End Date Taking? Authorizing Provider  LORazepam (ATIVAN) 1 MG tablet Take 1 tablet (1 mg total) by mouth 3 (three) times daily as needed (tremor). 12/05/12  Yes Marisa Severin, MD  omeprazole (PRILOSEC) 40 MG capsule Take 40 mg by mouth daily.   Yes Historical Provider, MD  Probiotic Product (PROBIOTIC DAILY PO) Take 1 tablet by mouth every other day.   Yes Historical Provider, MD  Physical Exam: Filed Vitals:   12/01/15 2213 12/01/15 2306  BP: 158/97 143/84  Pulse: 107 102  Temp: 98.4 F (36.9 C)   TempSrc: Oral   Resp: 18 18  SpO2: 94% 94%     Constitutional: Vital signs reviewed. Patient is a well-developed and well-nourished in no acute distress and cooperative  with exam. Alert and oriented x3.  Head: Normocephalic and atraumatic  Ear: TM normal bilaterally  Mouth: no erythema or exudates, MMM  Eyes: PERRL, EOMI, conjunctivae normal, No scleral icterus.  Neck: Supple, Trachea midline normal ROM, No JVD, mass, thyromegaly, or carotid bruit present.  Cardiovascular: RRR, S1 normal, S2 normal, no MRG, pulses symmetric and intact bilaterally  Pulmonary/Chest: CTAB, no wheezes, rales, or rhonchi  Abdominal: Soft. Non-tender, non-distended, bowel sounds are normal, no masses, organomegaly, or guarding present.  GU: no CVA tenderness Musculoskeletal: No joint deformities, erythema, or stiffness, ROM full and no nontender Ext: Trace edema and no cyanosis, pulses palpable bilaterally (DP and PT)  Hematology: no cervical, inginal, or axillary adenopathy.  Neurological: A&O x3, Strenght is normal and symmetric bilaterally, cranial nerve II-XII are grossly intact, no focal motor deficit, sensory intact to light touch bilaterally.  Skin: Warm, dry and intact. No rash, cyanosis, or clubbing.  Psychiatric: Anxious mood and affect. speech and behavior is normal. Judgment and thought content normal. Cognition and memory are normal.      Data Review   Micro Results No results found for this or any previous visit (from the past 240 hour(s)).  Radiology Reports Dg Chest 2 View  12/01/2015  CLINICAL DATA:  Left-sided chest pain.  Onset today. EXAM: CHEST  2 VIEW COMPARISON:  01/02/2009 FINDINGS: The cardiomediastinal contours are normal. The lungs are clear. Pulmonary vasculature is normal. No consolidation, pleural effusion, or pneumothorax. No acute osseous abnormalities are seen. IMPRESSION: No acute pulmonary process. Electronically Signed   By: Rubye OaksMelanie  Ehinger M.D.   On: 12/01/2015 23:50   Ct Abdomen Pelvis W Contrast  11/11/2015  CLINICAL DATA:  Right upper quadrant pain with nausea, constipation and diarrhea. Weight loss. EXAM: CT ABDOMEN AND PELVIS WITH  CONTRAST TECHNIQUE: Multidetector CT imaging of the abdomen and pelvis was performed using the standard protocol following bolus administration of intravenous contrast. CONTRAST:  100mL ISOVUE-300 IOPAMIDOL (ISOVUE-300) INJECTION 61% COMPARISON:  12/30/2008. FINDINGS: Lower chest: Lung bases show no acute findings. Heart size normal. Coronary artery calcification. No pericardial or pleural effusion. Hepatobiliary: Liver is slightly decreased in attenuation diffusely. Liver and gallbladder are otherwise unremarkable. No biliary ductal dilatation. Pancreas: Negative. Spleen: Negative. Adrenals/Urinary Tract: Adrenal glands and kidneys are unremarkable. Ureters are decompressed. Bladder is unremarkable. Stomach/Bowel: Small hiatal hernia. There are duodenal diverticula. Small bowel, appendix and colon are otherwise unremarkable. Vascular/Lymphatic: Minimal atherosclerotic calcification of the arterial vasculature. No pathologically enlarged lymph nodes. Reproductive: Hysterectomy. Air is incidentally noted in the vaginal cuff. Ovaries are visualized. Other: No free fluid.  Mesenteries and peritoneum are unremarkable. Musculoskeletal: No worrisome lytic or sclerotic lesions. Degenerative disc disease at L5-S1. IMPRESSION: 1. No findings to explain the patient's symptoms. 2. Coronary artery calcification. 3. Hepatic steatosis. Electronically Signed   By: Leanna BattlesMelinda  Blietz M.D.   On: 11/11/2015 10:49     CBC  Recent Labs Lab 12/01/15 2249  WBC 7.9  HGB 12.9  HCT 40.0  PLT 202  MCV 91.7  MCH 29.6  MCHC 32.3  RDW 13.9    Chemistries   Recent Labs Lab 12/01/15 2249  NA 141  K 3.9  CL 107  CO2 20*  GLUCOSE 126*  BUN 12  CREATININE 0.81  CALCIUM 9.0  MG 2.0  AST 21  ALT 18  ALKPHOS 70  BILITOT 0.6   ------------------------------------------------------------------------------------------------------------------ CrCl cannot be calculated (Unknown ideal  weight.). ------------------------------------------------------------------------------------------------------------------ No results for input(s): HGBA1C in the last 72 hours. ------------------------------------------------------------------------------------------------------------------ No results for input(s): CHOL, HDL, LDLCALC, TRIG, CHOLHDL, LDLDIRECT in the last 72 hours. ------------------------------------------------------------------------------------------------------------------ No results for input(s): TSH, T4TOTAL, T3FREE, THYROIDAB in the last 72 hours.  Invalid input(s): FREET3 ------------------------------------------------------------------------------------------------------------------ No results for input(s): VITAMINB12, FOLATE, FERRITIN, TIBC, IRON, RETICCTPCT in the last 72 hours.  Coagulation profile  Recent Labs Lab 12/01/15 2249  INR 1.03    No results for input(s): DDIMER in the last 72 hours.  Cardiac Enzymes  Recent Labs Lab 12/01/15 2249  TROPONINI <0.03   ------------------------------------------------------------------------------------------------------------------ Invalid input(s): POCBNP   CBG: No results for input(s): GLUCAP in the last 168 hours.     EKG: Independently reviewed.Sinus tachycardia   Assessment/Plan  Intermittent palpitations with chest pain: Risk factors include female, family history, hyperlipidemia, and age>55. - Admit to telemetry bed  - cycle CE q3 hr x3 and repeat her EKG in the am  - Nitroglycerin, Morphine, and aspirin  - Risk factor stratification: will check FLP and A1C  - Check TSH, if abnormal will follow-up with the free T4,  - Echocardiogram in a.m. - please call Cardiology in AM - Follow-up telemetry  Recent weight loss: Patient notes since November a 20 pound weight loss - Question hyperthyroidism as a possible cause of anxiety, tremor, palpitations, weight loss, and pretibial edema -  Checking   free T4  Anxiety -Continue home medications of Ativan prn   GERD - Pharmacy substitution of Protonix for omeprazole   Tobacco abuse - offering  patient a nicotine patch  Lovenox DVT prophylaxis   Code Status:   full Family Communication: bedside Disposition Plan: admit   Total time spent 55 minutes.Greater than 50% of this time was spent in counseling, explanation of diagnosis, planning of further management, and coordination of care  Clydie Braun [facility] Pager [phone]  If 7PM-7AM, please contact night-coverage [email or web address] Password [email or web address] 12/02/2015, 12:16 AM

## 2015-12-03 ENCOUNTER — Encounter (HOSPITAL_COMMUNITY)
Admission: EM | Disposition: A | Discharge status: Home / Self Care | Payer: Self-pay | Source: Home / Self Care | Attending: Emergency Medicine

## 2015-12-03 ENCOUNTER — Encounter (HOSPITAL_COMMUNITY): Payer: Self-pay | Admitting: Cardiovascular Disease

## 2015-12-03 DIAGNOSIS — E785 Hyperlipidemia, unspecified: Secondary | ICD-10-CM | POA: Diagnosis not present

## 2015-12-03 DIAGNOSIS — R002 Palpitations: Secondary | ICD-10-CM | POA: Diagnosis not present

## 2015-12-03 DIAGNOSIS — K219 Gastro-esophageal reflux disease without esophagitis: Secondary | ICD-10-CM | POA: Diagnosis not present

## 2015-12-03 DIAGNOSIS — R079 Chest pain, unspecified: Secondary | ICD-10-CM | POA: Diagnosis not present

## 2015-12-03 DIAGNOSIS — I209 Angina pectoris, unspecified: Secondary | ICD-10-CM | POA: Diagnosis not present

## 2015-12-03 DIAGNOSIS — Z8249 Family history of ischemic heart disease and other diseases of the circulatory system: Secondary | ICD-10-CM | POA: Diagnosis not present

## 2015-12-03 HISTORY — PX: CARDIAC CATHETERIZATION: SHX172

## 2015-12-03 LAB — CREATININE, SERUM
CREATININE: 0.8 mg/dL (ref 0.44–1.00)
GFR calc non Af Amer: >60 mL/min (ref >60)

## 2015-12-03 LAB — CBC
HCT: 37.6 % (ref 36.0–46.0)
Hemoglobin: 12.1 g/dL (ref 12.0–15.0)
MCH: 29.8 pg (ref 26.0–34.0)
MCHC: 32.2 g/dL (ref 30.0–36.0)
MCV: 92.6 fL (ref 78.0–100.0)
PLATELETS: 162 10*3/uL (ref 150–400)
RBC: 4.06 MIL/uL (ref 3.87–5.11)
RDW: 14.1 % (ref 11.5–15.5)
WBC: 5.4 10*3/uL (ref 4.0–10.5)

## 2015-12-03 LAB — HEMOGLOBIN A1C
HEMOGLOBIN A1C: 5.8 % — AB (ref 4.8–5.6)
MEAN PLASMA GLUCOSE: 120 mg/dL

## 2015-12-03 SURGERY — LEFT HEART CATH AND CORONARY ANGIOGRAPHY

## 2015-12-03 MED ORDER — ENOXAPARIN SODIUM 30 MG/0.3ML ~~LOC~~ SOLN: 30.0000 mg | SUBCUTANEOUS | Status: DC

## 2015-12-03 MED ORDER — HEPARIN (PORCINE) IN NACL 2-0.9 UNIT/ML-% IJ SOLN
INTRAMUSCULAR | Status: DC | PRN
  Administered 2015-12-03: 1500 mL

## 2015-12-03 MED ORDER — VERAPAMIL HCL 2.5 MG/ML IV SOLN
INTRAVENOUS | Status: AC
  Filled 2015-12-03: qty 2

## 2015-12-03 MED ORDER — HEPARIN (PORCINE) IN NACL 2-0.9 UNIT/ML-% IJ SOLN
INTRAMUSCULAR | Status: AC
  Filled 2015-12-03: qty 1500

## 2015-12-03 MED ORDER — SODIUM CHLORIDE 0.9% FLUSH: 3.0000 mL | Freq: Two times a day (BID) | INTRAVENOUS | Status: DC

## 2015-12-03 MED ORDER — LIDOCAINE HCL (PF) 1 % IJ SOLN
INTRAMUSCULAR | Status: DC | PRN
  Administered 2015-12-03: 2 mL

## 2015-12-03 MED ORDER — ATORVASTATIN CALCIUM 20 MG PO TABS
20.0000 mg | ORAL_TABLET | Freq: Every day | ORAL | Status: DC
  Administered 2015-12-03: 20 mg via ORAL
  Filled 2015-12-03: qty 1

## 2015-12-03 MED ORDER — LIDOCAINE HCL (PF) 1 % IJ SOLN
INTRAMUSCULAR | Status: AC
  Filled 2015-12-03: qty 30

## 2015-12-03 MED ORDER — MIDAZOLAM HCL 2 MG/2ML IJ SOLN
INTRAMUSCULAR | Status: AC
Start: 2015-12-03 — End: 2015-12-03
  Filled 2015-12-03: qty 2

## 2015-12-03 MED ORDER — FENTANYL CITRATE (PF) 100 MCG/2ML IJ SOLN
INTRAMUSCULAR | Status: AC
  Filled 2015-12-03: qty 2

## 2015-12-03 MED ORDER — SODIUM CHLORIDE 0.9 % WEIGHT BASED INFUSION
3.0000 mL/kg/h | INTRAVENOUS | Status: DC
  Administered 2015-12-03: 3 mL/kg/h via INTRAVENOUS

## 2015-12-03 MED ORDER — FENTANYL CITRATE (PF) 100 MCG/2ML IJ SOLN
INTRAMUSCULAR | Status: DC | PRN
  Administered 2015-12-03 (×3): 25 ug via INTRAVENOUS

## 2015-12-03 MED ORDER — MIDAZOLAM HCL 2 MG/2ML IJ SOLN
INTRAMUSCULAR | Status: DC | PRN
  Administered 2015-12-03 (×2): 1 mg via INTRAVENOUS
  Administered 2015-12-03: 2 mg via INTRAVENOUS

## 2015-12-03 MED ORDER — MIDAZOLAM HCL 2 MG/2ML IJ SOLN
INTRAMUSCULAR | Status: AC
  Filled 2015-12-03: qty 2

## 2015-12-03 MED ORDER — SODIUM CHLORIDE 0.9 % IV SOLN: 250.0000 mL | INTRAVENOUS | Status: DC | PRN

## 2015-12-03 MED ORDER — HEPARIN SODIUM (PORCINE) 1000 UNIT/ML IJ SOLN
INTRAMUSCULAR | Status: AC
  Filled 2015-12-03: qty 1

## 2015-12-03 MED ORDER — ONDANSETRON HCL 4 MG/2ML IJ SOLN
4.0000 mg | Freq: Four times a day (QID) | INTRAMUSCULAR | Status: DC | PRN
  Administered 2015-12-03: 4 mg via INTRAVENOUS
  Filled 2015-12-03: qty 2

## 2015-12-03 MED ORDER — VERAPAMIL HCL 2.5 MG/ML IV SOLN
INTRA_ARTERIAL | Status: DC | PRN
  Administered 2015-12-03: 10 mL via INTRA_ARTERIAL

## 2015-12-03 MED ORDER — IOPAMIDOL (ISOVUE-370) INJECTION 76%
INTRAVENOUS | Status: AC
  Filled 2015-12-03: qty 100

## 2015-12-03 MED ORDER — LORAZEPAM 1 MG PO TABS: 1.0000 mg | ORAL_TABLET | Freq: Three times a day (TID) | ORAL | Status: DC

## 2015-12-03 MED ORDER — HEPARIN SODIUM (PORCINE) 1000 UNIT/ML IJ SOLN
INTRAMUSCULAR | Status: DC | PRN
  Administered 2015-12-03: 4000 [IU] via INTRAVENOUS

## 2015-12-03 MED ORDER — ACETAMINOPHEN 325 MG PO TABS: 650.0000 mg | ORAL_TABLET | ORAL | Status: DC | PRN

## 2015-12-03 MED ORDER — OMEPRAZOLE 40 MG PO CPDR: 40.0000 mg | DELAYED_RELEASE_CAPSULE | Freq: Every day | ORAL | Status: DC

## 2015-12-03 MED ORDER — DIAZEPAM 5 MG PO TABS: 5.0000 mg | ORAL_TABLET | Freq: Four times a day (QID) | ORAL | Status: DC | PRN

## 2015-12-03 MED ORDER — SODIUM CHLORIDE 0.9% FLUSH: 3.0000 mL | INTRAVENOUS | Status: DC | PRN

## 2015-12-03 MED ORDER — IOPAMIDOL (ISOVUE-370) INJECTION 76%
INTRAVENOUS | Status: DC | PRN
  Administered 2015-12-03: 63 mL via INTRA_ARTERIAL

## 2015-12-03 SURGICAL SUPPLY — 10 items
CATH INFINITI 5FR ANG PIGTAIL (CATHETERS) ×2 IMPLANT
CATH OPTITORQUE TIG 4.0 5F (CATHETERS) ×2 IMPLANT
DEVICE RAD COMP TR BAND LRG (VASCULAR PRODUCTS) ×2 IMPLANT
GLIDESHEATH SLEND SS 6F .021 (SHEATH) ×2 IMPLANT
KIT HEART LEFT (KITS) ×3 IMPLANT
PACK CARDIAC CATHETERIZATION (CUSTOM PROCEDURE TRAY) ×3 IMPLANT
SYR MEDRAD MARK V 150ML (SYRINGE) ×3 IMPLANT
TRANSDUCER W/STOPCOCK (MISCELLANEOUS) ×3 IMPLANT
TUBING CIL FLEX 10 FLL-RA (TUBING) ×3 IMPLANT
WIRE SAFE-T 1.5MM-J .035X260CM (WIRE) ×2 IMPLANT

## 2015-12-03 NOTE — Progress Notes (Signed)
   PATIENT ID:  Lori Lester is a [age]F with hypertension, tobacco abuse and family history of premature CAD here with chest pain, nausea and vomiting.  SUBJECTIVE:  No chest pain.     PHYSICAL EXAM Filed Vitals:   12/02/15 1216 12/02/15 1643 12/02/15 2100 12/03/15 0326  BP: 122/73  110/61 105/56  Pulse: 91  68 73  Temp:   98.1 F (36.7 C) 98.1 F (36.7 C)  TempSrc:   Oral Oral  Resp:   20 17  Height:  5\' 5"  (1.651 m)    Weight:  85.73 kg (189 lb)  81.784 kg (180 lb 4.8 oz)  SpO2: 88%  98% 97%   Gen: Well-appearing.  Neck: No JVD COR: RRR. No m/r/g. Normal S1/S2  Lungs: CTAB.  Abd: Soft, NT,ND. +BS Ext: WWP. No edema.   LABS: Lab Results  Component Value Date   TROPONINI <0.03 12/02/2015   No results found for this or any previous visit (from the past 24 hour(s)).  Intake/Output Summary (Last 24 hours) at 12/03/15 1107 Last data filed at 12/03/15 0829  Gross per 24 hour  Intake 1064.61 ml  Output    875 ml  Net 189.61 ml    Telemetry: No events.  Sinus rhythm  ASSESSMENT AND PLAN:  Principal Problem:   Chest pain Active Problems:   Intermittent palpitations   Anxiety state   GERD (gastroesophageal reflux disease)   Tobacco abuse   Hyperlipidemia   Pain in the chest   # Unstable angina: Lori Lester denies recurrent episodes of angina overnight or this AM.  She is scheduled for cardiac catheterization today.  Continue aspirin.   # Palpitations: Lori Lester reports episodes of palpitations. No events have been noted on telemetry.  # Hyperlipidemia: ASCVD 10 year risk is 7.7%. We will add atorvastatin 20 mg daily. If she is found to have coronary disease on cath today we will increase the dose.  Jerame Hedding C. Duke Salviaandolph, MD, [facility] 12/03/2015 11:07 AM

## 2015-12-03 NOTE — Discharge Instructions (Signed)
PLEASE REMEMBER TO BRING ALL OF YOUR MEDICATIONS TO EACH OF YOUR FOLLOW-UP OFFICE VISITS.  PLEASE ATTEND ALL SCHEDULED FOLLOW-UP APPOINTMENTS.   Activity: Increase activity slowly as tolerated. You may shower, but no soaking baths (or swimming) for 1 week. No driving for 2 days. No lifting over 5 lbs for 1 week. No sexual activity for 1 week.   You May Return to Work: in 5 days  Wound Care: You may wash cath site gently with soap and water. Keep cath site clean and dry. If you notice pain, swelling, bleeding or pus at your cath site, please call [phone].    Cardiac Cath Site Care Refer to this sheet in the next few weeks. These instructions provide you with information on caring for yourself after your procedure. Your caregiver may also give you more specific instructions. Your treatment has been planned according to current medical practices, but problems sometimes occur. Call your caregiver if you have any problems or questions after your procedure. HOME CARE INSTRUCTIONS  You may shower 24 hours after the procedure. Remove the bandage (dressing) and gently wash the site with plain soap and water. Gently pat the site dry.   Do not apply powder or lotion to the site.   Do not sit in a bathtub, swimming pool, or whirlpool for 5 to 7 days.   No bending, squatting, or lifting anything over 10 pounds (4.5 kg) as directed by your caregiver.   Inspect the site at least twice daily.   Do not drive home if you are discharged the same day of the procedure. Have someone else drive you.   You may drive 24 hours after the procedure unless otherwise instructed by your caregiver.  What to expect:  Any bruising will usually fade within 1 to 2 weeks.   Blood that collects in the tissue (hematoma) may be painful to the touch. It should usually decrease in size and tenderness within 1 to 2 weeks.  SEEK IMMEDIATE MEDICAL CARE IF:  You have unusual pain at the site or down the affected limb.     You have redness, warmth, swelling, or pain at the site.   You have drainage (other than a small amount of blood on the dressing).   You have chills.   You have a fever or persistent symptoms for more than 72 hours.   You have a fever and your symptoms suddenly get worse.   Your leg becomes pale, cool, tingly, or numb.   You have heavy bleeding from the site. Hold pressure on the site.  Document Released: 09/05/2010 Document Revised: 07/23/2011 Document Reviewed: 09/05/2010 [facility] Patient Information 2012 [employer], [place].

## 2015-12-03 NOTE — H&P (View-Only) (Signed)
   PATIENT ID:  Ms. Kuklinski is a 59F with hypertension, tobacco abuse and family history of premature CAD here with chest pain, nausea and vomiting.  SUBJECTIVE:  No chest pain.     PHYSICAL EXAM Filed Vitals:   12/02/15 1216 12/02/15 1643 12/02/15 2100 12/03/15 0326  BP: 122/73  110/61 105/56  Pulse: 91  68 73  Temp:   98.1 F (36.7 C) 98.1 F (36.7 C)  TempSrc:   Oral Oral  Resp:   20 17  Height:  5' 5" (1.651 m)    Weight:  85.73 kg (189 lb)  81.784 kg (180 lb 4.8 oz)  SpO2: 88%  98% 97%   Gen: Well-appearing.  Neck: No JVD COR: RRR. No m/r/g. Normal S1/S2  Lungs: CTAB.  Abd: Soft, NT,ND. +BS Ext: WWP. No edema.   LABS: Lab Results  Component Value Date   TROPONINI <0.03 12/02/2015   No results found for this or any previous visit (from the past 24 hour(s)).  Intake/Output Summary (Last 24 hours) at 12/03/15 1107 Last data filed at 12/03/15 0829  Gross per 24 hour  Intake 1064.61 ml  Output    875 ml  Net 189.61 ml    Telemetry: No events.  Sinus rhythm  ASSESSMENT AND PLAN:  Principal Problem:   Chest pain Active Problems:   Intermittent palpitations   Anxiety state   GERD (gastroesophageal reflux disease)   Tobacco abuse   Hyperlipidemia   Pain in the chest   # Unstable angina: Ms. Bhandari denies recurrent episodes of angina overnight or this AM.  She is scheduled for cardiac catheterization today.  Continue aspirin.   # Palpitations: Ms. Moffet reports episodes of palpitations. No events have been noted on telemetry.  # Hyperlipidemia: ASCVD 10 year risk is 7.7%. We will add atorvastatin 20 mg daily. If she is found to have coronary disease on cath today we will increase the dose.  Kimesha Claxton C. [place], MD, FACC 12/03/2015 11:07 AM  

## 2015-12-03 NOTE — Progress Notes (Signed)
Received Lucienne Minksynthia Hitsman to room [id number].  TR Band intact with 11cc air per handoff report.  Vascular site assessment completed.  CCMD contacted to confirm patient on box 38.

## 2015-12-03 NOTE — Interval H&P Note (Signed)
Cath Lab Visit (complete for each Cath Lab visit)  Clinical Evaluation Leading to the Procedure:   ACS: No.  Non-ACS:    Anginal Classification: CCS III  Anti-ischemic medical therapy: Minimal Therapy (1 class of medications)  Non-Invasive Test Results: No non-invasive testing performed  Prior CABG: No previous CABG      History and Physical Interval Note:  12/03/2015 1:24 PM  Lori Lester  has presented today for surgery, with the diagnosis of cp  The various methods of treatment have been discussed with the patient and family. After consideration of risks, benefits and other options for treatment, the patient has consented to  Procedure(s): Left Heart Cath and Coronary Angiography (N/A) as a surgical intervention .  The patient's history has been reviewed, patient examined, no change in status, stable for surgery.  I have reviewed the patient's chart and labs.  Questions were answered to the patient's satisfaction.     Laporshia Hogen A

## 2015-12-03 NOTE — Care Management Note (Signed)
Case Management Note  Patient Details  Name: Laroy AppleCynthia D Ouderkirk MRN: [id number] Date of Birth: July 09, 1956  Subjective/Objective:   Chest pain                 Action/Plan: Discharge Planning: Chart reviewed. No NCM needs identified.  PCP Elvera LennoxGURLEY, SCOTT MD  Expected Discharge Date:  12/03/2015               Expected Discharge Plan:  Home/Self Care  In-House Referral:  NA  Discharge planning Services  CM Consult  Post Acute Care Choice:  NA Choice offered to:  NA  DME Arranged:  N/A DME Agency:  NA  HH Arranged:  NA HH Agency:  NA  Status of Service:  Completed, signed off  Medicare Important Message Given:    Date Medicare IM Given:    Medicare IM give by:    Date Additional Medicare IM Given:    Additional Medicare Important Message give by:     If discussed at Long Length of Stay Meetings, dates discussed:    Additional Comments:  Elliot CousinShavis, Callaway Hailes Ellen, RN 12/03/2015, 4:21 PM

## 2015-12-03 NOTE — Discharge Summary (Signed)
Physician Discharge Summary  Lori Lester:[id number] DOB: Jul 09, 1956 DOA: 12/01/2015  PCP: Delphia Grates  Admit date: 12/01/2015 Discharge date: 12/03/2015  Time spent: 35 minutes  Recommendations for Outpatient Follow-up:  1. Needs f/u PCP for multi-modal anxiety and Genella Rife follow up 2. rec Psychiatry to comment on her other chronic co-morbids 3. No coronary plaque-pcp to consider addition Statin-not LDL was slightly elevated this admit  Discharge Diagnoses:  Principal Problem:   Chest pain Active Problems:   Intermittent palpitations   Anxiety state   GERD (gastroesophageal reflux disease)   Tobacco abuse   Hyperlipidemia   Pain in the chest   Discharge Condition: improved  Diet recommendation: hh low salt  Filed Weights   12/02/15 1643 12/03/15 0326  Weight: 85.73 kg (189 lb) 81.784 kg (180 lb 4.8 oz)    History of present illness:  60 y/o female admitted for CP via Ed Heart score 4 Cardiology consulted See below  Hospital Course:  Patient Active Problem List   Diagnosis Date Noted  . Chest pain  Heart score 4. App cards input Cath was neg for Cor dx  12/02/2015    Priority: High  . Intermittent palpitations Anxiety IBS  OP management by psych cont Ativan 1 mg 3 times a day when necessary tremor Suspect may benefit also from addition of low-dose mood stabilizer-suggest outpatient initiation of Depakote/valproic acid-deferred to psychiatry    12/02/2015  . Anxiety state 12/02/2015  . Tremor Follow as an outpatient with neurologist-no defined pathology determined at present TSH is within normal limits Consider low-dose beta blocker?  12/12/2012  . GERD (gastroesophageal reflux disease) 12/02/2015  . Tobacco abuse Cessation is imperative Outpatient counseling needed   12/02/2015  . Hyperlipidemia-consioder OP statin LDL high, but no cor plaque Per PCP 12/02/2015         Procedures:  Cardiac  cath-neg (i.e. Studies not automatically included, echos, thoracentesis, etc; not x-rays)  Consultations:  Cardiology Dr. Duke Salvia  Cath Dr. Tresa Endo  Discharge Exam: Filed Vitals:   12/03/15 1414 12/03/15 1439  BP: 117/63 113/54  Pulse: 58 60  Temp:  97.6 F (36.4 C)  Resp: 11 18    General: lert pleasant oreinted in nad Cardiovascular: s1 s2 no m/r/g Respiratory: clear no added sound  Discharge Instructions   Discharge Instructions    Diet - low sodium heart healthy    Complete by:  As directed      Discharge instructions    Complete by:  As directed   Your chest pain wwasn't cardiac We increased your reflux medication dose-please fill it Please follow with your primary Md and your psychaitrist for further help as an Out-patient     Increase activity slowly    Complete by:  As directed           Current Discharge Medication List    CONTINUE these medications which have CHANGED   Details  omeprazole (PRILOSEC) 40 MG capsule Take 1 capsule (40 mg total) by mouth daily. Qty: 30 capsule, Refills: 0      CONTINUE these medications which have NOT CHANGED   Details  LORazepam (ATIVAN) 1 MG tablet Take 1 tablet (1 mg total) by mouth 3 (three) times daily as needed (tremor). Qty: 15 tablet, Refills: 0    Probiotic Product (PROBIOTIC DAILY PO) Take 1 tablet by mouth every other day.       Allergies  Allergen Reactions  . Codeine Nausea And Vomiting  . Sulfa Antibiotics Nausea And Vomiting  The results of significant diagnostics from this hospitalization (including imaging, microbiology, ancillary and laboratory) are listed below for reference.    Significant Diagnostic Studies: Dg Chest 2 View  12/01/2015  CLINICAL DATA:  Left-sided chest pain.  Onset today. EXAM: CHEST  2 VIEW COMPARISON:  01/02/2009 FINDINGS: The cardiomediastinal contours are normal. The lungs are clear. Pulmonary vasculature is normal. No consolidation, pleural effusion, or  pneumothorax. No acute osseous abnormalities are seen. IMPRESSION: No acute pulmonary process. Electronically Signed   By: Rubye OaksMelanie  Ehinger M.D.   On: 12/01/2015 23:50   Ct Abdomen Pelvis W Contrast  11/11/2015  CLINICAL DATA:  Right upper quadrant pain with nausea, constipation and diarrhea. Weight loss. EXAM: CT ABDOMEN AND PELVIS WITH CONTRAST TECHNIQUE: Multidetector CT imaging of the abdomen and pelvis was performed using the standard protocol following bolus administration of intravenous contrast. CONTRAST:  100mL ISOVUE-300 IOPAMIDOL (ISOVUE-300) INJECTION 61% COMPARISON:  12/30/2008. FINDINGS: Lower chest: Lung bases show no acute findings. Heart size normal. Coronary artery calcification. No pericardial or pleural effusion. Hepatobiliary: Liver is slightly decreased in attenuation diffusely. Liver and gallbladder are otherwise unremarkable. No biliary ductal dilatation. Pancreas: Negative. Spleen: Negative. Adrenals/Urinary Tract: Adrenal glands and kidneys are unremarkable. Ureters are decompressed. Bladder is unremarkable. Stomach/Bowel: Small hiatal hernia. There are duodenal diverticula. Small bowel, appendix and colon are otherwise unremarkable. Vascular/Lymphatic: Minimal atherosclerotic calcification of the arterial vasculature. No pathologically enlarged lymph nodes. Reproductive: Hysterectomy. Air is incidentally noted in the vaginal cuff. Ovaries are visualized. Other: No free fluid.  Mesenteries and peritoneum are unremarkable. Musculoskeletal: No worrisome lytic or sclerotic lesions. Degenerative disc disease at L5-S1. IMPRESSION: 1. No findings to explain the patient's symptoms. 2. Coronary artery calcification. 3. Hepatic steatosis. Electronically Signed   By: Leanna BattlesMelinda  Blietz M.D.   On: 11/11/2015 10:49    Microbiology: No results found for this or any previous visit (from the past 240 hour(s)).   Labs: Basic Metabolic Panel:  Recent Labs Lab 12/01/15 2249 12/02/15 0415  NA 141  141  K 3.9 3.9  CL 107 107  CO2 20* 24  GLUCOSE 126* 108*  BUN 12 10  CREATININE 0.81 0.78  CALCIUM 9.0 8.8*  MG 2.0  --    Liver Function Tests:  Recent Labs Lab 12/01/15 2249  AST 21  ALT 18  ALKPHOS 70  BILITOT 0.6  PROT 6.8  ALBUMIN 3.6   No results for input(s): LIPASE, AMYLASE in the last 168 hours. No results for input(s): AMMONIA in the last 168 hours. CBC:  Recent Labs Lab 12/01/15 2249 12/02/15 0415  WBC 7.9 6.8  NEUTROABS  --  4.7  HGB 12.9 12.0  HCT 40.0 36.9  MCV 91.7 91.3  PLT 202 176   Cardiac Enzymes:  Recent Labs Lab 12/01/15 2249 12/02/15 0110 12/02/15 0415  TROPONINI <0.03 <0.03 <0.03   BNP: BNP (last 3 results) No results for input(s): BNP in the last 8760 hours.  ProBNP (last 3 results) No results for input(s): PROBNP in the last 8760 hours.  CBG: No results for input(s): GLUCAP in the last 168 hours.     SignedRhetta Mura:  Kinsleigh Ludolph, JAI-GURMUKH MD   [facility] 12/03/2015, 2:56 PM

## 2015-12-12 ENCOUNTER — Other Ambulatory Visit: Payer: Self-pay | Admitting: *Deleted

## 2015-12-13 ENCOUNTER — Ambulatory Visit: Payer: BLUE CROSS/BLUE SHIELD | Admitting: Physician Assistant

## 2015-12-25 ENCOUNTER — Encounter: Payer: BLUE CROSS/BLUE SHIELD | Admitting: Physician Assistant

## 2015-12-25 NOTE — Progress Notes (Signed)
    Cardiology Office Note   Date:  12/25/2015   ID:  Lori Lester, DOB 05-17-1956, MRN [id number]  PCP:  Delphia GratesGURLEY,Scott, PA-C  Cardiologist:  Dr Verne Carrowandolph  Kamali Nephew, PA-C   No chief complaint on file.   History of Present Illness: Lori Lester is a 60 y.o. female with a history of obesity, HTN, tob use.  Seen for chest pain and had cath plus echo 04/18 with no significant abnormalities  No-show for appt 05/10.  This encounter was created in error - please disregard.

## 2016-01-01 ENCOUNTER — Ambulatory Visit (HOSPITAL_COMMUNITY)
Admission: RE | Admit: 2016-01-01 | Discharge: 2016-01-01 | Disposition: A | Discharge status: Home / Self Care | Payer: BLUE CROSS/BLUE SHIELD | Source: Ambulatory Visit | Attending: Gastroenterology | Admitting: Gastroenterology

## 2016-01-01 DIAGNOSIS — R110 Nausea: Secondary | ICD-10-CM | POA: Diagnosis not present

## 2016-01-01 DIAGNOSIS — R1011 Right upper quadrant pain: Secondary | ICD-10-CM | POA: Insufficient documentation

## 2016-01-01 MED ORDER — TECHNETIUM TC 99M MEBROFENIN IV KIT
5.5000 | PACK | Freq: Once | INTRAVENOUS | Status: AC | PRN
  Administered 2016-01-01: 6 via INTRAVENOUS

## 2016-01-03 ENCOUNTER — Encounter (HOSPITAL_COMMUNITY): Payer: Self-pay | Admitting: Emergency Medicine

## 2016-01-03 ENCOUNTER — Ambulatory Visit (INDEPENDENT_AMBULATORY_CARE_PROVIDER_SITE_OTHER): Payer: BLUE CROSS/BLUE SHIELD

## 2016-01-03 ENCOUNTER — Ambulatory Visit (HOSPITAL_COMMUNITY)
Admission: EM | Admit: 2016-01-03 | Discharge: 2016-01-03 | Disposition: A | Discharge status: Home / Self Care | Payer: BLUE CROSS/BLUE SHIELD | Attending: Emergency Medicine | Admitting: Emergency Medicine

## 2016-01-03 DIAGNOSIS — R1114 Bilious vomiting: Secondary | ICD-10-CM

## 2016-01-03 LAB — POCT I-STAT, CHEM 8
BUN: 7 mg/dL (ref 6–20)
CREATININE: 0.7 mg/dL (ref 0.44–1.00)
Calcium, Ion: 1.13 mmol/L (ref 1.12–1.23)
Chloride: 104 mmol/L (ref 101–111)
GLUCOSE: 93 mg/dL (ref 65–99)
HEMATOCRIT: 47 % — AB (ref 36.0–46.0)
HEMOGLOBIN: 16 g/dL — AB (ref 12.0–15.0)
Potassium: 3.4 mmol/L — ABNORMAL LOW (ref 3.5–5.1)
Sodium: 143 mmol/L (ref 135–145)
TCO2: 27 mmol/L (ref 0–100)

## 2016-01-03 NOTE — ED Notes (Signed)
Patient reports she is being evaluated by dr Ula Lingooutlaw-gi for nausea/vomiting episodes.  Episodes are random.   Episodes have been occuring for 4-5 weeks.  Had 3 episodes last night, so far, none today.  Patient had a scan earlier this week as ordered by dr outlaw.  Patient does not know results.  Patient has no diarrhea.  Last bm was last night. Patient reports notifying gi physicians office, given an appt for Monday and told to come to ucc to make sure she does not have a "bowle blockage"

## 2016-01-03 NOTE — Discharge Instructions (Signed)
Increase omeprazole to 2 tablets a day  Keep appointment with gastroenterologist for Monday   Prop head of bed up more at night to help with reflux

## 2016-01-03 NOTE — ED Provider Notes (Signed)
CSN: [id number]     Arrival date & time 01/03/16  1407 History   First MD Initiated Contact with Patient 01/03/16 1519     Chief Complaint  Patient presents with  . Emesis   (Consider location/radiation/quality/duration/timing/severity/associated sxs/prior Treatment) HPI History obtained from patient:  Pt presents with the cc of: vomiting bile Duration of symptoms:several weeks Treatment prior to arrival: omeprazole Context:ongoing issue of vomiting bile. Has had workup by GI doctor. Does not have reports at this time. Was advised to come to UC to rule out obstruction.  Other symptoms include: sensation of regurgitation. Pain score:0 FAMILY HISTORY:CAD many family members SOCIAL HISTORY:Previous smoker  Past Medical History  Diagnosis Date  . Hypertension   . Diverticulitis   . Tremor 12/12/2012  . Tobacco abuse    Past Surgical History  Procedure Laterality Date  . Abdominal hysterectomy    . Back surgery    . Cardiac catheterization N/A 12/03/2015    Procedure: Left Heart Cath and Coronary Angiography;  Surgeon: Lennette Bihari, MD;  Location: [facility] INVASIVE CV LAB;  Service: Cardiovascular;  Laterality: N/A;   No family history on file. Social History  Substance Use Topics  . Smoking status: Current Every Day Smoker -- 1.00 packs/day    Types: Cigarettes  . Smokeless tobacco: Not on file  . Alcohol Use: No   OB History    No data available     Review of Systems  Allergies  Codeine and Sulfa antibiotics  Home Medications   Prior to Admission medications   Medication Sig Start Date End Date Taking? Authorizing Provider  LORazepam (ATIVAN) 1 MG tablet Take 1 tablet (1 mg total) by mouth every 8 (eight) hours. 12/03/15   Rhetta Mura, MD  omeprazole (PRILOSEC) 40 MG capsule Take 1 capsule (40 mg total) by mouth daily. 12/03/15   Rhetta Mura, MD  Probiotic Product (PROBIOTIC DAILY PO) Take 1 tablet by mouth every other day.    Historical Provider, MD    Meds Ordered and Administered this Visit  Medications - No data to display  BP 124/72 mmHg  Pulse 72  Temp(Src) 98.2 F (36.8 C) (Oral)  Resp 16  SpO2 100% No data found.   Physical Exam NURSES NOTES AND VITAL SIGNS REVIEWED. CONSTITUTIONAL: Well developed, well nourished, no acute distress HEENT: normocephalic, atraumatic EYES: Conjunctiva normal NECK:normal ROM, supple, no adenopathy PULMONARY:No respiratory distress, normal effort ABDOMINAL: Soft, ND, NT BS+, No CVAT MUSCULOSKELETAL: Normal ROM of all extremities,  SKIN: warm and dry without rash PSYCHIATRIC: Mood and affect, behavior are normal  ED Course  Procedures (including critical care time)  Labs Review Labs Reviewed  POCT I-STAT, CHEM 8 - Abnormal; Notable for the following:    Potassium 3.4 (*)    Hemoglobin 16.0 (*)    HCT 47.0 (*)    All other components within normal limits    Imaging Review Dg Abd 1 View  01/03/2016  CLINICAL DATA:  Constipation, obstruction.  Abdominal pain EXAM: ABDOMEN - 1 VIEW COMPARISON:  CT abdomen pelvis 11/11/2015 FINDINGS: Normal bowel gas pattern. Negative for obstruction or ileus. Barium and diverticulum both lower left colon. Moderate retained stool in the colon. Negative for urinary tract calculi.  No acute skeletal abnormality. IMPRESSION: Retained stool in the colon.  Normal bowel gas pattern Electronically Signed   By: Marlan Palau M.D.   On: 01/03/2016 16:25  I HAVE PERSONALLY  REVIEWED AND DISCUSSED RESULTS OF  X-RAYS WITH PATIENT AND FAMILY PRIOR TO DISCHARGE.  Visual Acuity Review  Right Eye Distance:   Left Eye Distance:   Bilateral Distance:    Right Eye Near:   Left Eye Near:    Bilateral Near:      Total Visit Time:30 MINUTES "GREATER THAN 50% WAS SPENT IN COUNSELING AND COORDINATION OF CARE WITH THE PATIENT" DISCUSSION OF results of previous tests, current tests, follow up plans general discussion of why this is happening to her.  MDM   1.  Bilious vomiting with nausea     Patient is reassured that there are no issues that require transfer to higher level of care at this time or additional tests. Patient is advised to continue home symptomatic treatment. Patient is advised that if there are new or worsening symptoms to attend the emergency department, contact primary care provider, or return to UC. Instructions of care provided discharged home in stable condition.    THIS NOTE WAS GENERATED USING A VOICE RECOGNITION SOFTWARE PROGRAM. ALL REASONABLE EFFORTS  WERE MADE TO PROOFREAD THIS DOCUMENT FOR ACCURACY.  I have verbally reviewed the discharge instructions with the patient. A printed AVS was given to the patient.  All questions were answered prior to discharge.      Tharon AquasFrank C Makinna Andy, PA 01/03/16 1701

## 2016-01-06 LAB — POCT H PYLORI SCREEN: H. PYLORI SCREEN, POC: NEGATIVE

## 2016-01-07 ENCOUNTER — Encounter: Payer: Self-pay | Admitting: *Deleted

## 2016-06-27 ENCOUNTER — Ambulatory Visit (INDEPENDENT_AMBULATORY_CARE_PROVIDER_SITE_OTHER): Payer: Self-pay | Admitting: Family Medicine

## 2016-06-27 VITALS — BP 136/70 | HR 80 | Temp 98.0°F | Resp 18 | Ht 63.5 in | Wt 196.0 lb

## 2016-06-27 DIAGNOSIS — Z024 Encounter for examination for driving license: Secondary | ICD-10-CM

## 2016-06-27 NOTE — Patient Instructions (Signed)
     IF you received an x-ray today, you will receive an invoice from [facility]. Please contact [facility] at [phone] with questions or concerns regarding your invoice.   IF you received labwork today, you will receive an invoice from [facility]. Please contact [facility] at [phone] with questions or concerns regarding your invoice.   Our billing staff will not be able to assist you with questions regarding bills from these companies.  You will be contacted with the lab results as soon as they are available. The fastest way to get your results is to activate your My Chart account. Instructions are located on the last page of this paperwork. If you have not heard from us regarding the results in 2 weeks, please contact this office.      

## 2016-06-27 NOTE — Progress Notes (Signed)
Commercial Driver Medical Examination   Lori AppleCynthia D Bodin is a 60 y.o. female who presents today for a commercial driver fitness determination physical exam. The patient reports no problems. The following portions of the patient's history were reviewed and updated as appropriate: allergies, current medications, past family history, past social history, past surgical history and problem list. Review of Systems A comprehensive review of systems was negative.   Objective:    Vision:   Visual Acuity Screening   Right eye Left eye Both eyes  Without correction: 20/20 20/25 20/15   With correction:     Comments: Titmus 70% Colors 6/6   Applicant can recognize and distinguish among traffic control signals and devices showing standard red, green, and amber colors.   Monocular Vision?: No   Hearing: Hearing Screening Comments: Whisper test: 209ft    BP 136/70   Pulse 80   Temp 98 F (36.7 C) (Oral)   Resp 18   Ht 5' 3.5" (1.613 m)   Wt 196 lb (88.9 kg)   SpO2 97%   BMI 34.18 kg/m   General Appearance:    Alert, cooperative, no distress, appears stated age  Head:    Normocephalic, without obvious abnormality, atraumatic  Eyes:    PERRL, conjunctiva/corneas clear, EOM's intact, fundi    benign, both eyes  Ears:    Normal TM's and external ear canals, both ears  Nose:   Nares normal, septum midline, mucosa normal, no drainage    or sinus tenderness  Throat:   Lips, mucosa, and tongue normal; teeth and gums normal  Neck:   Supple, symmetrical, trachea midline, no adenopathy;    thyroid:  no enlargement/tenderness/nodules; no carotid   bruit or JVD  Back:     Symmetric, no curvature, ROM normal, no CVA tenderness  Lungs:     Clear to auscultation bilaterally, respirations unlabored  Chest Wall:    No tenderness or deformity   Heart:    Regular rate and rhythm, S1 and S2 normal, no murmur, rub   or gallop     Abdomen:     Soft, non-tender, bowel sounds active all four quadrants,     no masses, no organomegaly        Extremities:   Extremities normal, atraumatic, no cyanosis or edema  Pulses:   2+ and symmetric all extremities  Skin:   Skin color, texture, turgor normal, no rashes or lesions  Lymph nodes:   Cervical, supraclavicular, and axillary nodes normal  Neurologic:   CNII-XII intact, normal strength, sensation and reflexes    throughout     Labs:Lab Results  Component Value Date   PROTEINUR NEGATIVE 12/30/2008   BILIRUBINUR NEGATIVE 12/30/2008   GLUCOSEU NEGATIVE 12/30/2008   SG 1.020, neg prot, neg blood, neg sugar    Assessment:    Healthy female exam.  Meets standards in 349 CFR 391.41;  qualifies for 2 year certificate.    Plan:    Medical examiners certificate completed and printed. Return as needed.

## 2017-01-08 DIAGNOSIS — Z8639 Personal history of other endocrine, nutritional and metabolic disease: Secondary | ICD-10-CM | POA: Diagnosis not present

## 2017-01-08 DIAGNOSIS — E785 Hyperlipidemia, unspecified: Secondary | ICD-10-CM | POA: Diagnosis not present

## 2017-01-08 DIAGNOSIS — Z0000 Encounter for general adult medical examination without abnormal findings: Secondary | ICD-10-CM | POA: Diagnosis not present

## 2017-01-08 DIAGNOSIS — G250 Essential tremor: Secondary | ICD-10-CM | POA: Diagnosis not present

## 2017-01-21 ENCOUNTER — Other Ambulatory Visit: Payer: Self-pay | Admitting: Family Medicine

## 2017-01-21 DIAGNOSIS — Z1231 Encounter for screening mammogram for malignant neoplasm of breast: Secondary | ICD-10-CM

## 2017-01-25 DIAGNOSIS — Z1231 Encounter for screening mammogram for malignant neoplasm of breast: Secondary | ICD-10-CM | POA: Diagnosis not present

## 2017-04-15 DIAGNOSIS — E559 Vitamin D deficiency, unspecified: Secondary | ICD-10-CM | POA: Diagnosis not present

## 2017-04-15 DIAGNOSIS — Z720 Tobacco use: Secondary | ICD-10-CM | POA: Diagnosis not present

## 2017-05-14 DIAGNOSIS — R300 Dysuria: Secondary | ICD-10-CM | POA: Diagnosis not present

## 2017-05-14 DIAGNOSIS — Z713 Dietary counseling and surveillance: Secondary | ICD-10-CM | POA: Diagnosis not present

## 2017-06-22 DIAGNOSIS — E559 Vitamin D deficiency, unspecified: Secondary | ICD-10-CM | POA: Diagnosis not present

## 2017-06-22 DIAGNOSIS — J0100 Acute maxillary sinusitis, unspecified: Secondary | ICD-10-CM | POA: Diagnosis not present

## 2017-07-19 DIAGNOSIS — J329 Chronic sinusitis, unspecified: Secondary | ICD-10-CM | POA: Diagnosis not present

## 2017-09-16 DIAGNOSIS — M2550 Pain in unspecified joint: Secondary | ICD-10-CM | POA: Diagnosis not present

## 2017-09-16 DIAGNOSIS — G250 Essential tremor: Secondary | ICD-10-CM | POA: Diagnosis not present

## 2017-09-16 DIAGNOSIS — E559 Vitamin D deficiency, unspecified: Secondary | ICD-10-CM | POA: Diagnosis not present

## 2017-09-16 DIAGNOSIS — E785 Hyperlipidemia, unspecified: Secondary | ICD-10-CM | POA: Diagnosis not present

## 2017-12-16 DIAGNOSIS — R7303 Prediabetes: Secondary | ICD-10-CM | POA: Diagnosis not present

## 2017-12-16 DIAGNOSIS — Z0000 Encounter for general adult medical examination without abnormal findings: Secondary | ICD-10-CM | POA: Diagnosis not present

## 2017-12-16 DIAGNOSIS — E559 Vitamin D deficiency, unspecified: Secondary | ICD-10-CM | POA: Diagnosis not present

## 2017-12-16 DIAGNOSIS — E785 Hyperlipidemia, unspecified: Secondary | ICD-10-CM | POA: Diagnosis not present

## 2017-12-19 DIAGNOSIS — J019 Acute sinusitis, unspecified: Secondary | ICD-10-CM | POA: Diagnosis not present

## 2018-02-16 DIAGNOSIS — Z1231 Encounter for screening mammogram for malignant neoplasm of breast: Secondary | ICD-10-CM | POA: Diagnosis not present

## 2018-03-18 DIAGNOSIS — G250 Essential tremor: Secondary | ICD-10-CM | POA: Diagnosis not present

## 2018-03-18 DIAGNOSIS — R7303 Prediabetes: Secondary | ICD-10-CM | POA: Diagnosis not present

## 2018-03-18 DIAGNOSIS — E785 Hyperlipidemia, unspecified: Secondary | ICD-10-CM | POA: Diagnosis not present

## 2018-03-18 DIAGNOSIS — E559 Vitamin D deficiency, unspecified: Secondary | ICD-10-CM | POA: Diagnosis not present

## 2018-06-20 ENCOUNTER — Ambulatory Visit (INDEPENDENT_AMBULATORY_CARE_PROVIDER_SITE_OTHER): Payer: Self-pay | Admitting: Emergency Medicine

## 2018-06-20 ENCOUNTER — Encounter: Payer: Self-pay | Admitting: Emergency Medicine

## 2018-06-20 ENCOUNTER — Other Ambulatory Visit: Payer: Self-pay

## 2018-06-20 VITALS — BP 140/82 | HR 99 | Temp 98.6°F | Resp 18 | Ht 65.0 in | Wt 223.4 lb

## 2018-06-20 DIAGNOSIS — Z024 Encounter for examination for driving license: Secondary | ICD-10-CM

## 2018-06-20 NOTE — Progress Notes (Signed)
This patient presents for DOT examination for fitness for duty.   Medical History:  1. Head/Brain Injuries, disorders or illnesses no  2. Seizures, epilepsy no  3. Eye disorders or impaired vision (except corrective lenses) no  4. Ear disorders, loss of hearing or balance no  5. Heart disease or heart attack, other cardiovascular condition no  6. Heart surgery (valve replacement/bypass, angioplasty, pacemaker/defribrillator) no  7. High blood pressure no  8. High cholesterol no  9. Chronic cough, shortness of breath or other breathing problems no  10. Lung disease (emphysema, asthma or chronic bronchitis) no  11. Kidney disease, dialysis no  12. Digestive problems  no  13. Diabetes or elevated blood sugar no                      If yes to #13, Insulin use n/a  14. Nervious or psychiatric disorders, e.g., severe depression no  15. Fainting or syncope no  16. Dizziness, headaches, numbness, tingling or memory loss no  17. Unexplained weight loss no  18. Stroke, TIA or paralysis no  19. Missing or impaired hand, arm, foot, leg, finger, toe no  20. Spinal injury or disease no  21. Bone, muscles or nerve problems no  22. Blood clots or bleeding bleeding disorders no  23. Cancer no  24. Chronic infection or other chronic diseases no  25. Sleep disorders, pauses in breathing while asleep, daytime sleepiness, loud snoring no  26. Have you ever had a sleep test? no  27.  Have you ever spent a night in the hospital? yes  28. Have you ever had a broken bone? no  29. Have you or or do you use tobacco products? yes  30. Regular, frequent alcohol use no  31. Illegal substance use within the past 2 years no  32.  Have you ever failed a drug test or been dependent on an illegal substance? no   Current Medications: Prior to Admission medications   Medication Sig Start Date End Date Taking? Authorizing Provider  DULoxetine (CYMBALTA) 30 MG capsule Take 30 mg by mouth daily.   Yes  [provider]  omeprazole (PRILOSEC) 40 MG capsule Take 1 capsule (40 mg total) by mouth daily. 12/03/15  Yes Rhetta Mura, MD  propranolol (INDERAL) 40 MG tablet Take 40 mg by mouth 2 (two) times daily.   Yes [provider]  prochlorperazine (COMPAZINE) 5 MG tablet Take 5 mg by mouth 3 (three) times daily as needed for nausea or vomiting.    [provider]    Medical Examiner's Comments on Health History:  In good general medical condition.  TESTING:   Visual Acuity Screening   Right eye Left eye Both eyes  Without correction: 20/15 20/15 20/13   With correction:     Comments: Horizontal field is 85 degrees. Passed color test.   Hearing Screening Comments: Passed whisper test.   Monocular Vision: No.  Hearing Aid used for test: No. Hearing Aid required to to meet standard: No.  BP 140/82   Pulse 99   Temp 98.6 F (37 C) (Oral)   Resp 18   Ht 5\' 5"  (1.651 m)   Wt 223 lb 6.4 oz (101.3 kg)   SpO2 97%   BMI 37.18 kg/m  Pulse rate is regular; repeat BP: 136/84  Comments: UA SG 1.020 GL neg Pr neg Bl neg   PHYSICAL EXAMINATION:  General Appearance Not markedly obese. No tremor, signs of alcoholism,  problem drinking or drug abuse.   Skin Warm, dry and intact.  Eyes Pupils are equal, round and reactive to light and accommodation, extraocular movements are intact. No exophthalmos, no nystagmus.   Ears Normal external ears. External canal without occlusion. No scarring of the TM. No perforation of the TM.  Mouth and Throat Clear and moist. No irremedial deformities likely to interfere with breathing or swallowing.  Heart No murmurs, extra sounds, evidence of cardiomegaly. No pacemaker. No implantable defibrillator.  Lungs and Chest (excluding breasts) Normal chest expansion, respiratory rate, breath sounds. No cyanosis.  Abdomen and Viscera No liver enlargement. No splenic enlargement. No masses, bruits, hernias or significant abdominal wall  weakness.  Genitourinary  No inguinal or femoral hernia.  Spine and other musculoskeletal No tenderness, no limitation of motion, no deformities.   Extremities No loss or impairment of leg, foot, toe, arm, hand, finger. No perceptible limp, deformities, atrophy, weakness, paralysis, clubbing, edema, hypotonia. Patient has sufficient grasp and prehension to maintain steering wheel grip. Patient has sufficient mobility and strength in the lower limbs to operate pedals properly.  Neurologic Normal equilibrium, coordination, speech pattern. No paresthesia, asymmetry of deep tendon reflexes, sensory or positional abnormalities. No abnormality of patellar or Babinski's reflexes.  Gait Not antalgic or ataxic  Vascular Normal pulses. No carotid or arterial bruits. No varicose veins.     Certification Status: does meet standards for 2 year certificate.  Certification expires 06/20/2020.

## 2018-06-20 NOTE — Patient Instructions (Addendum)
If you have lab work done today you will be contacted with your lab results within the next 2 weeks.  If you have not heard from [place] then please contact us. The fastest way to get your results is to register for My Chart.   IF you received an x-ray today, you will receive an invoice from [facility]. Please contact [facility] at [phone] with questions or concerns regarding your invoice.   IF you received labwork today, you will receive an invoice from [facility]. Please contact [facility] at [phone] with questions or concerns regarding your invoice.   Our billing staff will not be able to assist you with questions regarding bills from these companies.  You will be contacted with the lab results as soon as they are available. The fastest way to get your results is to activate your My Chart account. Instructions are located on the last page of this paperwork. If you have not heard from [place] regarding the results in 2 weeks, please contact this office.    Health Maintenance, Female Adopting a healthy lifestyle and getting preventive care can go a long way to promote health and wellness. Talk with your health care provider about what schedule of regular examinations is right for you. This is a good chance for you to check in with your provider about disease prevention and staying healthy. In between checkups, there are plenty of things you can do on your own. Experts have done a lot of research about which lifestyle changes and preventive measures are most likely to keep you healthy. Ask your health care provider for more information. Weight and diet Eat a healthy diet  Be sure to include plenty of vegetables, fruits, low-fat dairy products, and lean protein.  Do not eat a lot of foods high in solid fats, added sugars, or salt.  Get regular exercise. This is one of the most important things you can do for your health. ? Most adults should exercise for at least 150  minutes each week. The exercise should increase your heart rate and make you sweat (moderate-intensity exercise). ? Most adults should also do strengthening exercises at least twice a week. This is in addition to the moderate-intensity exercise.  Maintain a healthy weight  Body mass index (BMI) is a measurement that can be used to identify possible weight problems. It estimates body fat based on height and weight. Your health care provider can help determine your BMI and help you achieve or maintain a healthy weight.  For females 90 years of age and older: ? A BMI below 18.5 is considered underweight. ? A BMI of 18.5 to 24.9 is normal. ? A BMI of 25 to 29.9 is considered overweight. ? A BMI of 30 and above is considered obese.  Watch levels of cholesterol and blood lipids  You should start having your blood tested for lipids and cholesterol at 61 years of age, then have this test every 5 years.  You may need to have your cholesterol levels checked more often if: ? Your lipid or cholesterol levels are high. ? You are older than 61 years of age. ? You are at high risk for heart disease.  Cancer screening Lung Cancer  Lung cancer screening is recommended for adults 69-37 years old who are at high risk for lung cancer because of a history of smoking.  A yearly low-dose CT scan of the lungs is recommended for people who: ? Currently smoke. ? Have quit within  the past 15 years. ? Have at least a 30-pack-year history of smoking. A pack year is smoking an average of one pack of cigarettes a day for 1 year.  Yearly screening should continue until it has been 15 years since you quit.  Yearly screening should stop if you develop a health problem that would prevent you from having lung cancer treatment.  Breast Cancer  Practice breast self-awareness. This means understanding how your breasts normally appear and feel.  It also means doing regular breast self-exams. Let your health care  provider know about any changes, no matter how small.  If you are in your 20s or 30s, you should have a clinical breast exam (CBE) by a health care provider every 1-3 years as part of a regular health exam.  If you are 40 or older, have a CBE every year. Also consider having a breast X-ray (mammogram) every year.  If you have a family history of breast cancer, talk to your health care provider about genetic screening.  If you are at high risk for breast cancer, talk to your health care provider about having an MRI and a mammogram every year.  Breast cancer gene (BRCA) assessment is recommended for women who have family members with BRCA-related cancers. BRCA-related cancers include: ? Breast. ? Ovarian. ? Tubal. ? Peritoneal cancers.  Results of the assessment will determine the need for genetic counseling and BRCA1 and BRCA2 testing.  Cervical Cancer Your health care provider may recommend that you be screened regularly for cancer of the pelvic organs (ovaries, uterus, and vagina). This screening involves a pelvic examination, including checking for microscopic changes to the surface of your cervix (Pap test). You may be encouraged to have this screening done every 3 years, beginning at age 21.  For women ages 30-65, health care providers may recommend pelvic exams and Pap testing every 3 years, or they may recommend the Pap and pelvic exam, combined with testing for human papilloma virus (HPV), every 5 years. Some types of HPV increase your risk of cervical cancer. Testing for HPV may also be done on women of any age with unclear Pap test results.  Other health care providers may not recommend any screening for nonpregnant women who are considered low risk for pelvic cancer and who do not have symptoms. Ask your health care provider if a screening pelvic exam is right for you.  If you have had past treatment for cervical cancer or a condition that could lead to cancer, you need Pap tests  and screening for cancer for at least 20 years after your treatment. If Pap tests have been discontinued, your risk factors (such as having a new sexual partner) need to be reassessed to determine if screening should resume. Some women have medical problems that increase the chance of getting cervical cancer. In these cases, your health care provider may recommend more frequent screening and Pap tests.  Colorectal Cancer  This type of cancer can be detected and often prevented.  Routine colorectal cancer screening usually begins at 61 years of age and continues through 61 years of age.  Your health care provider may recommend screening at an earlier age if you have risk factors for colon cancer.  Your health care provider may also recommend using home test kits to check for hidden blood in the stool.  A small camera at the end of a tube can be used to examine your colon directly (sigmoidoscopy or colonoscopy). This is done to check for   the earliest forms of colorectal cancer.  Routine screening usually begins at age 50.  Direct examination of the colon should be repeated every 5-10 years through 61 years of age. However, you may need to be screened more often if early forms of precancerous polyps or small growths are found.  Skin Cancer  Check your skin from head to toe regularly.  Tell your health care provider about any new moles or changes in moles, especially if there is a change in a mole's shape or color.  Also tell your health care provider if you have a mole that is larger than the size of a pencil eraser.  Always use sunscreen. Apply sunscreen liberally and repeatedly throughout the day.  Protect yourself by wearing long sleeves, pants, a wide-brimmed hat, and sunglasses whenever you are outside.  Heart disease, diabetes, and high blood pressure  High blood pressure causes heart disease and increases the risk of stroke. High blood pressure is more likely to develop  in: ? People who have blood pressure in the high end of the normal range (130-139/85-89 mm Hg). ? People who are overweight or obese. ? People who are African American.  If you are 18-39 years of age, have your blood pressure checked every 3-5 years. If you are 40 years of age or older, have your blood pressure checked every year. You should have your blood pressure measured twice-once when you are at a hospital or clinic, and once when you are not at a hospital or clinic. Record the average of the two measurements. To check your blood pressure when you are not at a hospital or clinic, you can use: ? An automated blood pressure machine at a pharmacy. ? A home blood pressure monitor.  If you are between 55 years and 79 years old, ask your health care provider if you should take aspirin to prevent strokes.  Have regular diabetes screenings. This involves taking a blood sample to check your fasting blood sugar level. ? If you are at a normal weight and have a low risk for diabetes, have this test once every three years after 61 years of age. ? If you are overweight and have a high risk for diabetes, consider being tested at a younger age or more often. Preventing infection Hepatitis B  If you have a higher risk for hepatitis B, you should be screened for this virus. You are considered at high risk for hepatitis B if: ? You were born in a country where hepatitis B is common. Ask your health care provider which countries are considered high risk. ? Your parents were born in a high-risk country, and you have not been immunized against hepatitis B (hepatitis B vaccine). ? You have HIV or AIDS. ? You use needles to inject street drugs. ? You live with someone who has hepatitis B. ? You have had sex with someone who has hepatitis B. ? You get hemodialysis treatment. ? You take certain medicines for conditions, including cancer, organ transplantation, and autoimmune conditions.  Hepatitis C  Blood  testing is recommended for: ? Everyone born from 1945 through 1965. ? Anyone with known risk factors for hepatitis C.  Sexually transmitted infections (STIs)  You should be screened for sexually transmitted infections (STIs) including gonorrhea and chlamydia if: ? You are sexually active and are younger than 61 years of age. ? You are older than 61 years of age and your health care provider tells you that you are at risk for this   type of infection. ? Your sexual activity has changed since you were last screened and you are at an increased risk for chlamydia or gonorrhea. Ask your health care provider if you are at risk.  If you do not have HIV, but are at risk, it may be recommended that you take a prescription medicine daily to prevent HIV infection. This is called pre-exposure prophylaxis (PrEP). You are considered at risk if: ? You are sexually active and do not regularly use condoms or know the HIV status of your partner(s). ? You take drugs by injection. ? You are sexually active with a partner who has HIV.  Talk with your health care provider about whether you are at high risk of being infected with HIV. If you choose to begin PrEP, you should first be tested for HIV. You should then be tested every 3 months for as long as you are taking PrEP. Pregnancy  If you are premenopausal and you may become pregnant, ask your health care provider about preconception counseling.  If you may become pregnant, take 400 to 800 micrograms (mcg) of folic acid every day.  If you want to prevent pregnancy, talk to your health care provider about birth control (contraception). Osteoporosis and menopause  Osteoporosis is a disease in which the bones lose minerals and strength with aging. This can result in serious bone fractures. Your risk for osteoporosis can be identified using a bone density scan.  If you are 28 years of age or older, or if you are at risk for osteoporosis and fractures, ask your  health care provider if you should be screened.  Ask your health care provider whether you should take a calcium or vitamin D supplement to lower your risk for osteoporosis.  Menopause may have certain physical symptoms and risks.  Hormone replacement therapy may reduce some of these symptoms and risks. Talk to your health care provider about whether hormone replacement therapy is right for you. Follow these instructions at home:  Schedule regular health, dental, and eye exams.  Stay current with your immunizations.  Do not use any tobacco products including cigarettes, chewing tobacco, or electronic cigarettes.  If you are pregnant, do not drink alcohol.  If you are breastfeeding, limit how much and how often you drink alcohol.  Limit alcohol intake to no more than 1 drink per day for nonpregnant women. One drink equals 12 ounces of beer, 5 ounces of wine, or 1 ounces of hard liquor.  Do not use street drugs.  Do not share needles.  Ask your health care provider for help if you need support or information about quitting drugs.  Tell your health care provider if you often feel depressed.  Tell your health care provider if you have ever been abused or do not feel safe at home. This information is not intended to replace advice given to you by your health care provider. Make sure you discuss any questions you have with your health care provider. Document Released: 02/16/2011 Document Revised: 01/09/2016 Document Reviewed: 05/07/2015 Elsevier Interactive Patient Education  [employer].

## 2020-08-09 ENCOUNTER — Telehealth (HOSPITAL_COMMUNITY): Payer: Self-pay | Admitting: Family

## 2020-08-09 NOTE — Telephone Encounter (Signed)
Called to discuss with Lori Lester about Covid symptoms and the use of casirivimab/imdevimab, a combination monoclonal antibody infusion for those with mild to moderate Covid symptoms and at a high risk of hospitalization.     Pt is qualified for this infusion at the infusion center due to co-morbid conditions and/or a member of an at-risk group, however does not qualify per clinic guidelines which are operating by NIH/NCHHS guidelines as symptoms started 08/03/20. Symptoms tier reviewed as well as criteria for ending isolation.  Symptoms reviewed that would warrant ED/Hospital evaluation. Preventative practices reviewed. Patient verbalized understanding.     Patient Active Problem List   Diagnosis Date Noted   Intermittent palpitations 12/02/2015   Chest pain 12/02/2015   Anxiety state 12/02/2015   GERD (gastroesophageal reflux disease) 12/02/2015   Tobacco abuse 12/02/2015   Hyperlipidemia 12/02/2015   Pain in the chest    Tremor 12/12/2012    Lori Hyson,NP

## 2021-02-07 ENCOUNTER — Emergency Department (HOSPITAL_COMMUNITY): Payer: Commercial Managed Care - PPO

## 2021-02-07 ENCOUNTER — Emergency Department (HOSPITAL_COMMUNITY)
Admission: EM | Admit: 2021-02-07 | Discharge: 2021-02-08 | Disposition: A | Discharge status: Home / Self Care | Payer: Commercial Managed Care - PPO | Attending: Emergency Medicine | Admitting: Emergency Medicine

## 2021-02-07 DIAGNOSIS — R1011 Right upper quadrant pain: Secondary | ICD-10-CM | POA: Diagnosis not present

## 2021-02-07 DIAGNOSIS — F17210 Nicotine dependence, cigarettes, uncomplicated: Secondary | ICD-10-CM | POA: Diagnosis not present

## 2021-02-07 DIAGNOSIS — I10 Essential (primary) hypertension: Secondary | ICD-10-CM | POA: Diagnosis not present

## 2021-02-07 LAB — COMPREHENSIVE METABOLIC PANEL
ALT: 22 U/L (ref 0–44)
AST: 21 U/L (ref 15–41)
Albumin: 3.7 g/dL (ref 3.5–5.0)
Alkaline Phosphatase: 87 U/L (ref 38–126)
Anion gap: 8 (ref 5–15)
BUN: 13 mg/dL (ref 8–23)
CO2: 25 mmol/L (ref 22–32)
Calcium: 9.1 mg/dL (ref 8.9–10.3)
Chloride: 107 mmol/L (ref 98–111)
Creatinine, Ser: 0.86 mg/dL (ref 0.44–1.00)
GFR, Estimated: >60 mL/min (ref >60)
Glucose, Bld: 116 mg/dL — ABNORMAL HIGH (ref 70–99)
Potassium: 3.7 mmol/L (ref 3.5–5.1)
Sodium: 140 mmol/L (ref 135–145)
Total Bilirubin: 0.3 mg/dL (ref 0.3–1.2)
Total Protein: 7.2 g/dL (ref 6.5–8.1)

## 2021-02-07 LAB — CBC WITH DIFFERENTIAL/PLATELET
Abs Immature Granulocytes: 0.06 10*3/uL (ref 0.00–0.07)
Basophils Absolute: 0 10*3/uL (ref 0.0–0.1)
Basophils Relative: 0 %
Eosinophils Absolute: 0.1 10*3/uL (ref 0.0–0.5)
Eosinophils Relative: 1 %
HCT: 40.2 % (ref 36.0–46.0)
Hemoglobin: 13 g/dL (ref 12.0–15.0)
Immature Granulocytes: 1 %
Lymphocytes Relative: 24 %
Lymphs Abs: 2.5 10*3/uL (ref 0.7–4.0)
MCH: 30.2 pg (ref 26.0–34.0)
MCHC: 32.3 g/dL (ref 30.0–36.0)
MCV: 93.3 fL (ref 80.0–100.0)
Monocytes Absolute: 0.9 10*3/uL (ref 0.1–1.0)
Monocytes Relative: 9 %
Neutro Abs: 6.7 10*3/uL (ref 1.7–7.7)
Neutrophils Relative %: 65 %
Platelets: 232 10*3/uL (ref 150–400)
RBC: 4.31 MIL/uL (ref 3.87–5.11)
RDW: 14.5 % (ref 11.5–15.5)
WBC: 10.3 10*3/uL (ref 4.0–10.5)
nRBC: 0 % (ref 0.0–0.2)

## 2021-02-07 LAB — LIPASE, BLOOD: Lipase: 29 U/L (ref 11–51)

## 2021-02-07 MED ORDER — IOHEXOL 300 MG/ML  SOLN
100.0000 mL | Freq: Once | INTRAMUSCULAR | Status: AC | PRN
  Administered 2021-02-07: 100 mL via INTRAVENOUS

## 2021-02-07 MED ORDER — ONDANSETRON 4 MG PO TBDP
4.0000 mg | ORAL_TABLET | Freq: Once | ORAL | Status: AC
  Administered 2021-02-07: 4 mg via ORAL
  Filled 2021-02-07: qty 1

## 2021-02-07 NOTE — ED Provider Notes (Signed)
[facility] EMERGENCY DEPARTMENT Provider Note   CSN: [id number] Arrival date & time: 02/07/21  1747     History Chief Complaint  Patient presents with   Abdominal Pain    Lori Lester is a 65 y.o. female.  Presented to the emergency room with concern for right upper quadrant pain.  Pain ongoing for about a week.  Primarily on the right side of her abdomen, right upper abdomen.  Has had some associated nausea with vomiting.  Nonbloody, nonbilious.  No identified alleviating or aggravating factors.  States her primary care doctor was principally concern for possible gallbladder etiology and sent to ER for further evaluation.  No fevers, no chest pain, no sob.   HPI     Past Medical History:  Diagnosis Date   Diverticulitis    Hypertension    Tobacco abuse    Tremor 12/12/2012    Patient Active Problem List   Diagnosis Date Noted   Intermittent palpitations 12/02/2015   Chest pain 12/02/2015   Anxiety state 12/02/2015   GERD (gastroesophageal reflux disease) 12/02/2015   Tobacco abuse 12/02/2015   Hyperlipidemia 12/02/2015   Pain in the chest    Tremor 12/12/2012    Past Surgical History:  Procedure Laterality Date   ABDOMINAL HYSTERECTOMY     BACK SURGERY     CARDIAC CATHETERIZATION N/A 12/03/2015   Procedure: Left Heart Cath and Coronary Angiography;  Surgeon: Lennette Bihari, MD;  Location: [facility] INVASIVE CV LAB;  Service: Cardiovascular;  Laterality: N/A;     OB History   No obstetric history on file.     No family history on file.  Social History   Tobacco Use   Smoking status: Every Day    Packs/day: 1.00    Pack years: 0.00    Types: Cigarettes   Smokeless tobacco: Never  Substance Use Topics   Alcohol use: No    Alcohol/week: 0.0 standard drinks   Drug use: No    Home Medications Prior to Admission medications   Medication Sig Start Date End Date Taking? Authorizing Provider  acetaminophen (TYLENOL) 500 MG tablet Take  1,000 mg by mouth every 6 (six) hours as needed (pain).   Yes [provider]  ALPRAZolam (XANAX) 0.25 MG tablet Take 0.25 mg by mouth daily as needed for anxiety.   Yes [provider]  buPROPion HCl (WELLBUTRIN PO) Take 1 tablet by mouth every morning.   Yes [provider]  DULoxetine (CYMBALTA) 60 MG capsule Take 60 mg by mouth every morning.   Yes [provider]  ergocalciferol (VITAMIN D2) 1.25 MG (50000 UT) capsule Take 50,000 Units by mouth every Monday.   Yes [provider]  omeprazole (PRILOSEC) 20 MG capsule Take 20 mg by mouth every morning.   Yes [provider]    Allergies    Codeine and Sulfa antibiotics  Review of Systems   Review of Systems  Constitutional:  Negative for chills and fever.  HENT:  Negative for ear pain and sore throat.   Eyes:  Negative for pain and visual disturbance.  Respiratory:  Negative for cough and shortness of breath.   Cardiovascular:  Negative for chest pain and palpitations.  Gastrointestinal:  Positive for abdominal pain, nausea and vomiting.  Genitourinary:  Negative for dysuria and hematuria.  Musculoskeletal:  Negative for arthralgias and back pain.  Skin:  Negative for color change and rash.  Neurological:  Negative for seizures and syncope.  All other systems  reviewed and are negative.  Physical Exam Updated Vital Signs BP 129/87   Pulse 82   Temp 98.9 F (37.2 C) (Oral)   Resp 19   SpO2 93%   Physical Exam Vitals and nursing note reviewed.  Constitutional:      General: She is not in acute distress.    Appearance: She is well-developed.  HENT:     Head: Normocephalic and atraumatic.  Eyes:     Conjunctiva/sclera: Conjunctivae normal.  Cardiovascular:     Rate and Rhythm: Normal rate and regular rhythm.     Heart sounds: No murmur heard. Pulmonary:     Effort: Pulmonary effort is normal. No respiratory distress.     Breath sounds: Normal breath sounds.   Abdominal:     Palpations: Abdomen is soft.     Comments: Tenderness to palpation noted in right upper quadrant and right mid abdomen, otherwise soft, no rebound or guarding  Musculoskeletal:     Cervical back: Neck supple.  Skin:    General: Skin is warm and dry.  Neurological:     General: No focal deficit present.     Mental Status: She is alert.  Psychiatric:        Mood and Affect: Mood normal.        Behavior: Behavior normal.    ED Results / Procedures / Treatments   Labs (all labs ordered are listed, but only abnormal results are displayed) Labs Reviewed  COMPREHENSIVE METABOLIC PANEL - Abnormal; Notable for the following components:      Result Value   Glucose, Bld 116 (*)    All other components within normal limits  URINALYSIS, ROUTINE W REFLEX MICROSCOPIC - Abnormal; Notable for the following components:   Specific Gravity, Urine >1.046 (*)    All other components within normal limits  CBC WITH DIFFERENTIAL/PLATELET  LIPASE, BLOOD    EKG EKG Interpretation  Date/Time:  Friday February 07 2021 17:50:19 EDT Ventricular Rate:  93 PR Interval:  138 QRS Duration: 82 QT Interval:  352 QTC Calculation: 437 R Axis:   -12 Text Interpretation: Normal sinus rhythm no acute changes when compared to ecg on 11/2015 Confirmed by Marianna Fuss ([id number]) on 02/09/2021 7:16:41 PM  Radiology CT ABDOMEN PELVIS W CONTRAST  Result Date: 02/07/2021 CLINICAL DATA:  Right upper quadrant pain radiating to shoulder, episode of emesis on Tuesday 9, intermittent diarrhea EXAM: CT ABDOMEN AND PELVIS WITH CONTRAST TECHNIQUE: Multidetector CT imaging of the abdomen and pelvis was performed using the standard protocol following bolus administration of intravenous contrast. CONTRAST:  100 mL Omnipaque 300 IV COMPARISON:  CT 11/11/2015 FINDINGS: Lower chest: Bibasilar atelectasis. Calcifications of the mitral annulus. Normal heart size. No pericardial effusion. Hepatobiliary: Diffuse hepatic  hypoattenuation compatible with hepatic steatosis. No concerning focal liver lesion. Smooth liver surface contour. Normal gallbladder and biliary tree without visible calcified gallstone, pericholecystic fluid or inflammation or significant biliary ductal dilatation. Pancreas: Partial fatty replacement of the pancreas. No pancreatic ductal dilatation or surrounding inflammatory changes. Spleen: Normal in size. No concerning splenic lesions. Adrenals/Urinary Tract: Normal adrenals. Kidneys are normally located with symmetric enhancement and excretion. Few fluid attenuation renal sinus cysts as well as several smaller subcentimeter hypoattenuating foci too small to fully characterize on CT imaging but statistically likely benign. No suspicious renal lesion, urolithiasis or hydronephrosis. Urinary bladder is unremarkable for the degree of distention. Stomach/Bowel: Sliding-type distal hiatal hernia. Distal stomach is unremarkable. Redundancy of the duodenal sweep prior to crossing midline. Multiple air and debris  filled duodenal diverticula are seen, similar to comparison prior. No small bowel thickening or dilatation. Normal appendix in the right lower quadrant. No proximal colonic thickening or dilatation. Numerous distal colonic diverticula. Some mild segmental thickening of the proximal to mid sigmoid is similar to comparisons and may reflect sequela of prior inflammatory change. No acute diverticular inflammation to suggest an active diverticulitis. No bowel obstruction. Vascular/Lymphatic: Atherosclerotic calcifications within the abdominal aorta and branch vessels. No aneurysm or ectasia. No enlarged abdominopelvic lymph nodes. Reproductive: Uterus is surgically absent. Quiescent retained ovarian parenchyma. No concerning adnexal lesions. Other: No abdominopelvic free fluid or free gas. No bowel containing hernias. Musculoskeletal: No acute osseous abnormality or suspicious osseous lesion. Grade 1  anterolisthesis L4 on L5. Mild retrolisthesis L5 on S1. No associated spondylolysis. Discogenic and facet degenerative changes are maximal at these levels as well. Additional degenerative changes in the hips and pelvis. IMPRESSION: 1. No acute CT abnormality is seen to provide a cause for patient's symptoms, specifically the gallbladder is unremarkable without CT evidence of acute cholecystitis or biliary ductal dilatation. 2. Hepatic steatosis. 3. Extensive distal colonic diverticula with mild segmental thickening of the proximal sigmoid, albeit without surrounding pericolonic or diverticular inflammation to suggest an acute process. May reflect postinflammatory sequela and should warrant further evaluation with outpatient visualization if not recently performed. 4. Tortuous duodenal sweep with multiple noninflamed duodenal diverticula, similar to prior. Electronically Signed   By: Kreg Shropshire M.D.   On: 02/07/2021 23:46    Procedures Procedures   Medications Ordered in ED Medications  ondansetron (ZOFRAN-ODT) disintegrating tablet 4 mg (4 mg Oral Given 02/07/21 2039)  iohexol (OMNIPAQUE) 300 MG/ML solution 100 mL (100 mLs Intravenous Contrast Given 02/07/21 2336)  alum & mag hydroxide-simeth (MAALOX/MYLANTA) 200-200-20 MG/5ML suspension 30 mL (30 mLs Oral Given 02/08/21 0234)    And  lidocaine (XYLOCAINE) 2 % viscous mouth solution 15 mL (15 mLs Oral Given 02/08/21 0234)  hyoscyamine (LEVSIN SL) SL tablet 0.25 mg (0.25 mg Sublingual Given 02/08/21 2542)    ED Course  I have reviewed the triage vital signs and the nursing notes.  Pertinent labs & imaging results that were available during my care of the patient were reviewed by me and considered in my medical decision making (see chart for details).    MDM Rules/Calculators/A&P                          65 year old lady presented to ER with concern for right upper quadrant pain.  On exam well-appearing, no distress, noted some tenderness in the  right upper quadrant and right mid abdomen.  Basic labs including LFTs, lipase were all grossly within normal limits.  Right upper quadrant ultrasound was negative for acute biliary pathology.  CT scan was ordered to further assess.  At time of signout, awaiting CT, UA and reassessment.  See Cardama's note for final plan and dispo.   Final Clinical Impression(s) / ED Diagnoses Final diagnoses:  RUQ pain    Rx / DC Orders ED Discharge Orders     None        Milagros Loll, MD 02/09/21 1918

## 2021-02-07 NOTE — ED Provider Notes (Signed)
Emergency Medicine Provider Triage Evaluation Note  Lori Lester , a 65 y.o. female  was evaluated in triage.  Pt complains of right upper quadrant abdominal pain x1 week.  She describes it as radiating towards her right subscapular region.  She states that today she feels awful, but denies any obvious fevers or chills at home.  She has been having intermittent nausea with yellowish emesis.  She also endorses yellow loose stools.  Her symptoms began after she ate pork.  Denies any obvious aggravating or alleviating factors.  Her primary care provider was concerned for gallbladder disease prompting her to come to the ED for evaluation.  She does not drink alcohol.  Review of Systems  Positive: RUQ abdominal pain, N/V Negative: Urinary sx, fevers, chills  Physical Exam  There were no vitals taken for this visit. Gen:   Awake, no distress   Resp:  Normal effort  MSK:   Moves extremities without difficulty  Other:  Abdomen soft. Focal RUQ abdominal tenderness. Positive Murphy's sign.   Medical Decision Making  Medically screening exam initiated at 5:50 PM.  Appropriate orders placed.  KERRINGTON SOVA was informed that the remainder of the evaluation will be completed by another provider, this initial triage assessment does not replace that evaluation, and the importance of remaining in the ED until their evaluation is complete.  Concern for cholecystitis   Lorelee New, PA-C 02/07/21 1807    Virgina Norfolk, DO 02/07/21 2059

## 2021-02-07 NOTE — ED Triage Notes (Signed)
Pt sent from PCP to rule out gallbladder issues. RUQ pain, radiates to shoulder, 1 episode of emesis Tuesday night, intermittent diarrhea since. Nothing makes it better, states foods "go straight through" her. States she just "doesn't feel good."

## 2021-02-08 LAB — URINALYSIS, ROUTINE W REFLEX MICROSCOPIC
Bilirubin Urine: NEGATIVE
Glucose, UA: NEGATIVE mg/dL
Hgb urine dipstick: NEGATIVE
Ketones, ur: NEGATIVE mg/dL
Leukocytes,Ua: NEGATIVE
Nitrite: NEGATIVE
Protein, ur: NEGATIVE mg/dL
Specific Gravity, Urine: >1.046 — ABNORMAL HIGH (ref 1.005–1.030)
pH: 5 (ref 5.0–8.0)

## 2021-02-08 MED ORDER — LIDOCAINE VISCOUS HCL 2 % MT SOLN
15.0000 mL | Freq: Once | OROMUCOSAL | Status: AC
  Administered 2021-02-08: 15 mL via ORAL
  Filled 2021-02-08: qty 15

## 2021-02-08 MED ORDER — HYOSCYAMINE SULFATE 0.125 MG SL SUBL
0.2500 mg | SUBLINGUAL_TABLET | Freq: Once | SUBLINGUAL | Status: AC
  Administered 2021-02-08: 0.25 mg via SUBLINGUAL
  Filled 2021-02-08: qty 2

## 2021-02-08 MED ORDER — ALUM & MAG HYDROXIDE-SIMETH 200-200-20 MG/5ML PO SUSP
30.0000 mL | Freq: Once | ORAL | Status: AC
  Administered 2021-02-08: 30 mL via ORAL
  Filled 2021-02-08: qty 30

## 2021-02-08 NOTE — ED Notes (Signed)
E-signature pad unavailable at time of pt discharge. This RN discussed discharge materials with pt and answered all pt questions. Pt stated understanding of discharge material. ? ?

## 2021-02-08 NOTE — ED Provider Notes (Signed)
I assumed care of this patient.  Please see previous provider note for further details of Hx, PE.  Briefly patient is a 65 y.o. female who presented right upper quadrant abdominal pain pending CT scan and UA.  CT scan without evidence of serious intra-abdominal findings or signs infectious process or bowel obstruction.  UA negative.  Previously patient had ultrasound that was negative as well.  Patient pain improved and tolerating oral intake.  The patient appears reasonably screened and/or stabilized for discharge and I doubt any other medical condition or other [facility] requiring further screening, evaluation, or treatment in the ED at this time prior to discharge. Safe for discharge with strict return precautions.  Disposition: Discharge  Condition: Good  I have discussed the results, Dx and Tx plan with the patient/family who expressed understanding and agree(s) with the plan. Discharge instructions discussed at length. The patient/family was given strict return precautions who verbalized understanding of the instructions. No further questions at time of discharge.    ED Discharge Orders     None         Follow Up: Tally Joe, MD [address] [place] [address] [phone]  Go to  as scheduled this week        Nira Conn, MD 02/08/21 [phone]

## 2021-02-08 NOTE — ED Notes (Signed)
Pt ambulated self to bathroom for urine sample 

## 2021-03-10 ENCOUNTER — Ambulatory Visit (HOSPITAL_COMMUNITY)
Admission: EM | Admit: 2021-03-10 | Discharge: 2021-03-10 | Disposition: A | Discharge status: Home / Self Care | Payer: Commercial Managed Care - PPO | Attending: Internal Medicine | Admitting: Internal Medicine

## 2021-03-10 ENCOUNTER — Encounter (HOSPITAL_COMMUNITY): Payer: Self-pay | Admitting: *Deleted

## 2021-03-10 ENCOUNTER — Ambulatory Visit (INDEPENDENT_AMBULATORY_CARE_PROVIDER_SITE_OTHER): Payer: Commercial Managed Care - PPO

## 2021-03-10 ENCOUNTER — Other Ambulatory Visit: Payer: Self-pay

## 2021-03-10 DIAGNOSIS — W19XXXA Unspecified fall, initial encounter: Secondary | ICD-10-CM | POA: Diagnosis not present

## 2021-03-10 DIAGNOSIS — M25511 Pain in right shoulder: Secondary | ICD-10-CM

## 2021-03-10 DIAGNOSIS — S93602A Unspecified sprain of left foot, initial encounter: Secondary | ICD-10-CM | POA: Diagnosis not present

## 2021-03-10 DIAGNOSIS — M79672 Pain in left foot: Secondary | ICD-10-CM | POA: Diagnosis not present

## 2021-03-10 DIAGNOSIS — S46911A Strain of unspecified muscle, fascia and tendon at shoulder and upper arm level, right arm, initial encounter: Secondary | ICD-10-CM | POA: Diagnosis not present

## 2021-03-10 NOTE — Discharge Instructions (Addendum)
-  Rest, ice, elevation -Take Tylenol 1000 mg 3 times daily, and ibuprofen 800 mg 3 times daily with food.  You can take these together, or alternate every 3-4 hours. -Use your boot while walking and standing as long as you're having pain -Follow-up with [facility] in 3-4 days if symptoms are not improving, information below.  You can schedule this online, call them, or go to their walk-in clinic Monday through Saturday.  Hours are 8-8 on Monday through Friday, and 10-3 on Saturday. -If you need extended work leave or documentation for insurance, follow-up with PCP or [facility]

## 2021-03-10 NOTE — ED Notes (Signed)
DonJoy not working. 

## 2021-03-10 NOTE — ED Provider Notes (Signed)
[facility]    CSN: [id number] Arrival date & time: 03/10/21  1150      History   Chief Complaint Chief Complaint  Patient presents with   Foot Injury   Fall   Rt shoulder    HPI Lori Lester is a 65 y.o. female presenting with multiple musculoskeletal complaints following a fall due to tripping.  Medical history GERD, hyperlipidemia, tobacco abuse.  States that she tripped while walking on some concrete and fell onto her left knee and caught herself with her right hand.  Now experiencing left foot pain and right shoulder pain.  Denies any other musculoskeletal complaints.  Adamantly denies hitting her head, loss of consciousness, dizziness, headaches, vision changes.  Denies abdominal pain, change in bowel or bladder function, hematuria.   HPI  Past Medical History:  Diagnosis Date   Diverticulitis    Hypertension    Tobacco abuse    Tremor 12/12/2012    Patient Active Problem List   Diagnosis Date Noted   Intermittent palpitations 12/02/2015   Chest pain 12/02/2015   Anxiety state 12/02/2015   GERD (gastroesophageal reflux disease) 12/02/2015   Tobacco abuse 12/02/2015   Hyperlipidemia 12/02/2015   Pain in the chest    Tremor 12/12/2012    Past Surgical History:  Procedure Laterality Date   ABDOMINAL HYSTERECTOMY     BACK SURGERY     CARDIAC CATHETERIZATION N/A 12/03/2015   Procedure: Left Heart Cath and Coronary Angiography;  Surgeon: Lennette Bihari, MD;  Location: [facility] INVASIVE CV LAB;  Service: Cardiovascular;  Laterality: N/A;    OB History   No obstetric history on file.      Home Medications    Prior to Admission medications   Medication Sig Start Date End Date Taking? Authorizing Provider  acetaminophen (TYLENOL) 500 MG tablet Take 1,000 mg by mouth every 6 (six) hours as needed (pain).    [provider]  ALPRAZolam Prudy Feeler) 0.25 MG tablet Take 0.25 mg by mouth daily as needed for anxiety.    [provider]   buPROPion HCl (WELLBUTRIN PO) Take 1 tablet by mouth every morning.    [provider]  DULoxetine (CYMBALTA) 60 MG capsule Take 60 mg by mouth every morning.    [provider]  ergocalciferol (VITAMIN D2) 1.25 MG (50000 UT) capsule Take 50,000 Units by mouth every Monday.    [provider]  omeprazole (PRILOSEC) 20 MG capsule Take 20 mg by mouth every morning.    [provider]    Family History History reviewed. No pertinent family history.  Social History Social History   Tobacco Use   Smoking status: Every Day    Packs/day: 1.00    Types: Cigarettes   Smokeless tobacco: Never  Substance Use Topics   Alcohol use: No    Alcohol/week: 0.0 standard drinks   Drug use: No     Allergies   Codeine and Sulfa antibiotics   Review of Systems Review of Systems  Constitutional:  Negative for chills, fever and unexpected weight change.  Respiratory:  Negative for chest tightness and shortness of breath.   Cardiovascular:  Negative for chest pain and palpitations.  Gastrointestinal:  Negative for abdominal pain, diarrhea, nausea and vomiting.  Genitourinary:  Negative for decreased urine volume, difficulty urinating and frequency.  Musculoskeletal:  Negative for arthralgias, back pain, gait problem, joint swelling, myalgias, neck pain and neck stiffness.       R shoulder pain L foot pain  Skin:  Negative for wound.  Neurological:  Negative for dizziness, tremors, seizures, syncope, facial asymmetry, speech difficulty, weakness, light-headedness, numbness and headaches.  All other systems reviewed and are negative.   Physical Exam Triage Vital Signs ED Triage Vitals  Enc Vitals Group     BP 03/10/21 1434 (!) 148/62     Pulse Rate 03/10/21 1434 83     Resp 03/10/21 1434 18     Temp 03/10/21 1434 99 F (37.2 C)     Temp src --      SpO2 03/10/21 1434 100 %     Weight --      Height --      Head Circumference --      Peak Flow --       Pain Score 03/10/21 1436 3     Pain Loc --      Pain Edu? --      Excl. in GC? --    No data found.  Updated Vital Signs BP (!) 148/62   Pulse 83   Temp 99 F (37.2 C)   Resp 18   SpO2 100%   Visual Acuity Right Eye Distance:   Left Eye Distance:   Bilateral Distance:    Right Eye Near:   Left Eye Near:    Bilateral Near:     Physical Exam Vitals reviewed.  Constitutional:      General: She is not in acute distress.    Appearance: Normal appearance. She is not ill-appearing or diaphoretic.  HENT:     Head: Normocephalic and atraumatic.  Cardiovascular:     Rate and Rhythm: Normal rate and regular rhythm.     Heart sounds: Normal heart sounds.  Pulmonary:     Effort: Pulmonary effort is normal.     Breath sounds: Normal breath sounds.  Abdominal:     Comments: No abd pain  Musculoskeletal:     Comments: R shoulder- TTP anterior aspect. Pain elicited with abduction against resistance. No obvious deformity of shoulder or clavicle. No clavicular or AC joint tenderness. Negative Beer, Juanetta Gosling, Neer.   L foot- TTP distal metatarsals 2-4, and along 5th metatarsal. DP 2+, cap refill <2 seconds, sensation intact, no medial or lateral malleolar tenderness.   Skin:    General: Skin is warm.  Neurological:     General: No focal deficit present.     Mental Status: She is alert and oriented to person, place, and time.     Comments: CN 2-12 grossly intact PERRLA, EOMI  Psychiatric:        Mood and Affect: Mood normal.        Behavior: Behavior normal.        Thought Content: Thought content normal.        Judgment: Judgment normal.     UC Treatments / Results  Labs (all labs ordered are listed, but only abnormal results are displayed) Labs Reviewed - No data to display  EKG   Radiology DG Shoulder Right  Result Date: 03/10/2021 CLINICAL DATA:  anterior Pain - fall EXAM: RIGHT SHOULDER - 2+ VIEW COMPARISON:  None. FINDINGS: There is no acute fracture or  dislocation. There is mild glenohumeral and AC joint degenerative change. IMPRESSION: No acute fracture or dislocation. Electronically Signed   By: Caprice Renshaw   On: 03/10/2021 15:41   DG Foot Complete Left  Result Date: 03/10/2021 CLINICAL DATA:  Fall, medial foot pain EXAM: LEFT FOOT - COMPLETE 3+ VIEW COMPARISON:  None. FINDINGS:  There is no evidence of fracture or dislocation. There is no evidence of arthropathy or other focal bone abnormality. Soft tissues are unremarkable. IMPRESSION: No fracture or dislocation of the left foot. Electronically Signed   By: Lauralyn Primes M.D.   On: 03/10/2021 15:39    Procedures Procedures (including critical care time)  Medications Ordered in UC Medications - No data to display  Initial Impression / Assessment and Plan / UC Course  I have reviewed the triage vital signs and the nursing notes.  Pertinent labs & imaging results that were available during my care of the patient were reviewed by me and considered in my medical decision making (see chart for details).     This patient is a very pleasant 65 y.o. year old female presenting with R shoulder sprain and L foot sprain following fall that occurred today. Neurovascularly intact.   Xray R shoulder negative Xray L foot negative  Discussed cannot rule out occult fracture. Given extent of pain, rec CAM boot and limiting weightbearing while pain persists. F/u with ortho in about 3 days if no improvement for recheck.   CAM boot, shoulder sling, RICE, tylenol/ibuprofen.   Work note provided. ED return precautions discussed. Patient verbalizes understanding and agreement.    Final Clinical Impressions(s) / UC Diagnoses   Final diagnoses:  Foot sprain, left, initial encounter  Strain of right shoulder, initial encounter  Fall, initial encounter     Discharge Instructions      -Rest, ice, elevation -Take Tylenol 1000 mg 3 times daily, and ibuprofen 800 mg 3 times daily with food.  You can  take these together, or alternate every 3-4 hours. -Use your boot while walking and standing as long as you're having pain -Follow-up with [facility] in 3-4 days if symptoms are not improving, information below.  You can schedule this online, call them, or go to their walk-in clinic Monday through Saturday.  Hours are 8-8 on Monday through Friday, and 10-3 on Saturday. -If you need extended work leave or documentation for insurance, follow-up with PCP or [facility]     ED Prescriptions   None    PDMP not reviewed this encounter.   Rhys Martini, PA-C 03/10/21 1609

## 2021-03-10 NOTE — ED Triage Notes (Signed)
Pt reports a trip and fall today . Pt now has Lt shoulder  and Lt  foot pain.

## 2021-03-27 ENCOUNTER — Emergency Department (HOSPITAL_COMMUNITY)
Admission: EM | Admit: 2021-03-27 | Discharge: 2021-03-27 | Disposition: A | Discharge status: Home / Self Care | Payer: Commercial Managed Care - PPO | Attending: Emergency Medicine | Admitting: Emergency Medicine

## 2021-03-27 ENCOUNTER — Encounter (HOSPITAL_COMMUNITY): Payer: Self-pay

## 2021-03-27 ENCOUNTER — Other Ambulatory Visit: Payer: Self-pay

## 2021-03-27 ENCOUNTER — Emergency Department (HOSPITAL_COMMUNITY): Payer: Commercial Managed Care - PPO

## 2021-03-27 DIAGNOSIS — S82851A Displaced trimalleolar fracture of right lower leg, initial encounter for closed fracture: Secondary | ICD-10-CM | POA: Insufficient documentation

## 2021-03-27 DIAGNOSIS — S99911A Unspecified injury of right ankle, initial encounter: Secondary | ICD-10-CM | POA: Diagnosis present

## 2021-03-27 DIAGNOSIS — M25571 Pain in right ankle and joints of right foot: Secondary | ICD-10-CM | POA: Insufficient documentation

## 2021-03-27 DIAGNOSIS — I10 Essential (primary) hypertension: Secondary | ICD-10-CM | POA: Diagnosis not present

## 2021-03-27 DIAGNOSIS — W010XXA Fall on same level from slipping, tripping and stumbling without subsequent striking against object, initial encounter: Secondary | ICD-10-CM | POA: Insufficient documentation

## 2021-03-27 DIAGNOSIS — F17210 Nicotine dependence, cigarettes, uncomplicated: Secondary | ICD-10-CM | POA: Insufficient documentation

## 2021-03-27 MED ORDER — ONDANSETRON 4 MG PO TBDP: ORAL_TABLET | ORAL | 0 refills | Status: DC

## 2021-03-27 MED ORDER — ONDANSETRON HCL 4 MG/2ML IJ SOLN
4.0000 mg | Freq: Once | INTRAMUSCULAR | Status: AC
  Administered 2021-03-27: 4 mg via INTRAVENOUS
  Filled 2021-03-27: qty 2

## 2021-03-27 MED ORDER — FENTANYL CITRATE (PF) 100 MCG/2ML IJ SOLN
50.0000 ug | Freq: Once | INTRAMUSCULAR | Status: AC
Start: 2021-03-27 — End: 2021-03-27
  Administered 2021-03-27: 50 ug via INTRAVENOUS
  Filled 2021-03-27: qty 2

## 2021-03-27 MED ORDER — OXYCODONE-ACETAMINOPHEN 5-325 MG PO TABS
1.0000 | ORAL_TABLET | Freq: Once | ORAL | Status: AC
  Administered 2021-03-27: 1 via ORAL
  Filled 2021-03-27: qty 1

## 2021-03-27 MED ORDER — OXYCODONE-ACETAMINOPHEN 5-325 MG PO TABS: 1.0000 | ORAL_TABLET | Freq: Four times a day (QID) | ORAL | 0 refills | Status: DC | PRN

## 2021-03-27 NOTE — ED Provider Notes (Signed)
[facility]-EMERGENCY DEPT Provider Note   CSN: [id number] Arrival date & time: 03/27/21  1505     History Chief Complaint  Patient presents with  . Fall    Lori Lester is a 65 y.o. female.  Lori Lester is a 65 y.o. female with a history of hypertension, diverticulitis, tobacco abuse, who presents to the ED via EMS for evaluation of fall and injury to the right ankle.  Patient reports she was accompanying her husband to a doctor's appointment across the street they initially went to the wrong building so they were walking through an area that was sloped with pine needles to get over to the corrective doctor's office and she slipped in the pine needles felt her ankle roll and heard a pop and then fell.  She reports that she was able to brace herself on her elbows and did not hit her head, no LOC.  No neck or back pain.  All of her pain is located in her right ankle and foot.  She had immediate pain and swelling.  EMS applied a temporary splint and she received 100 mcg of fentanyl in route with improvement in her pain but she feels like this is starting to wear off now.  She feels some tingling and pins-and-needles sensation in her foot but is able to move her toes.  Denies any pain at the knee or hip and no pain in her other extremities.  No previous ankle injuries or surgeries.  No other aggravating or alleviating factors, not on anticoagulation.  The history is provided by the patient.      Past Medical History:  Diagnosis Date  . Diverticulitis   . Hypertension   . Tobacco abuse   . Tremor 12/12/2012    Patient Active Problem List   Diagnosis Date Noted  . Intermittent palpitations 12/02/2015  . Chest pain 12/02/2015  . Anxiety state 12/02/2015  . GERD (gastroesophageal reflux disease) 12/02/2015  . Tobacco abuse 12/02/2015  . Hyperlipidemia 12/02/2015  . Pain in the chest   . Tremor 12/12/2012    Past Surgical History:  Procedure  Laterality Date  . ABDOMINAL HYSTERECTOMY    . BACK SURGERY    . CARDIAC CATHETERIZATION N/A 12/03/2015   Procedure: Left Heart Cath and Coronary Angiography;  Surgeon: Lennette Biharihomas A Kelly, MD;  [facility];  Service: Cardiovascular;  Laterality: N/A;     OB History   No obstetric history on file.     History reviewed. No pertinent family history.  Social History   Tobacco Use  . Smoking status: Every Day    Packs/day: 1.00    Types: Cigarettes  . Smokeless tobacco: Never  Substance Use Topics  . Alcohol use: No    Alcohol/week: 0.0 standard drinks  . Drug use: No    Home Medications Prior to Admission medications   Medication Sig Start Date End Date Taking? Authorizing Provider  ondansetron (ZOFRAN ODT) 4 MG disintegrating tablet 4mg  ODT q4 hours prn nausea/vomit 03/27/21  Yes Janah Mcculloh, Arva ChafeKelsey N, PA-C  oxyCODONE-acetaminophen (PERCOCET) 5-325 MG tablet Take 1 tablet by mouth every 6 (six) hours as needed. 03/27/21  Yes Dartha LodgeFord, Mirella Gueye N, PA-C  acetaminophen (TYLENOL) 500 MG tablet Take 1,000 mg by mouth every 6 (six) hours as needed (pain).    [provider]  ALPRAZolam Prudy Feeler(XANAX) 0.25 MG tablet Take 0.25 mg by mouth daily as needed for anxiety.    [provider]  buPROPion HCl [facility](WELLBUTRIN  PO) Take 1 tablet by mouth every morning.    [provider]  DULoxetine (CYMBALTA) 60 MG capsule Take 60 mg by mouth every morning.    [provider]  ergocalciferol (VITAMIN D2) 1.25 MG (50000 UT) capsule Take 50,000 Units by mouth every Monday.    [provider]  omeprazole (PRILOSEC) 20 MG capsule Take 20 mg by mouth every morning.    [provider]    Allergies    Codeine and Sulfa antibiotics  Review of Systems   Review of Systems  Constitutional:  Negative for chills and fever.  Genitourinary:  Negative for dysuria.  Musculoskeletal:  Positive for arthralgias and joint swelling.  Skin:  Negative for color change and  rash.  Neurological:  Negative for weakness and numbness.  All other systems reviewed and are negative.  Physical Exam Updated Vital Signs BP (!) 164/72 (BP Location: Left Arm)   Pulse 73   Temp 100 F (37.8 C) (Oral)   Resp 16   Ht 5\' 5"  (1.651 m)   Wt 101 kg   SpO2 97%   BMI 37.05 kg/m   Physical Exam Vitals and nursing note reviewed.  Constitutional:      General: She is not in acute distress.    Appearance: Normal appearance. She is well-developed. She is not ill-appearing or diaphoretic.  HENT:     Head: Normocephalic and atraumatic.     Comments: No tenderness or signs of head trauma Eyes:     General:        Right eye: No discharge.        Left eye: No discharge.  Neck:     Comments: No midline c-spin tenderness, normal ROM Cardiovascular:     Rate and Rhythm: Normal rate and regular rhythm.     Pulses: Normal pulses.  Pulmonary:     Effort: Pulmonary effort is normal. No respiratory distress.  Chest:     Chest wall: No tenderness.  Abdominal:     Tenderness: There is no abdominal tenderness.  Musculoskeletal:        General: Tenderness and deformity present.     Cervical back: Neck supple. No tenderness.     Comments: Tenderness swelling and deformity to the right ankle there is also some tenderness along the fifth metatarsal.  Patient unable to move ankle due to pain but is able to wiggle the toes.  DP pulses 2+, normal sensation.  No tenderness at the knee or hip.  All other joints supple and easily movable, all compartments soft.  No midline spinal tenderness.  Skin:    General: Skin is warm and dry.  Neurological:     Mental Status: She is alert and oriented to person, place, and time.     Coordination: Coordination normal.  Psychiatric:        Mood and Affect: Mood normal.        Behavior: Behavior normal.    ED Results / Procedures / Treatments   Labs (all labs ordered are listed, but only abnormal results are displayed) Labs Reviewed - No data  to display  EKG None  Radiology DG Ankle Complete Right  Result Date: 03/27/2021 CLINICAL DATA:  05/27/2021 today and injured ankle. EXAM: RIGHT ANKLE - COMPLETE 3+ VIEW COMPARISON:  None. FINDINGS: Trimalleolar ankle fracture. There is a largely transverse minimally displaced fracture through the base of the medial malleolus. Comminuted and mildly displaced fracture of the distal fibular shaft at and above the level of the  ankle mortise. There is also a slightly displaced posterior malleolus fracture noted lateral film. The mid and hindfoot bony structures appear intact. IMPRESSION: Trimalleolar ankle fracture. Electronically Signed   By: Rudie Meyer M.D.   On: 03/27/2021 16:50   DG Foot Complete Right  Result Date: 03/28/2021 CLINICAL DATA:  Fall, rolled ankle, lateral foot and ankle pain is EXAM: RIGHT FOOT COMPLETE - 3+ VIEW COMPARISON:  Same-day radiograph FINDINGS: Redemonstration of a trimalleolar ankle fracture better detailed on dedicated ankle radiographs. No clear acute fracture or traumatic malalignment is seen at the level of the foot within the limitations of this nonweightbearing exam. Mild soft tissue swelling is most pronounced medial and laterally extending from the extensive ankle swelling. Diffuse mild degenerative changes throughout the foot. Plantar calcaneal spur. IMPRESSION: Redemonstration of a trimalleolar ankle fracture, better detailed on same day ankle radiograph. Soft tissue swelling of the foot extends medial and laterally from the more profound ankle swelling. No clear acute fracture or traumatic malalignment of the foot within limitations of a nonweightbearing exam. Degenerative changes as above. Electronically Signed   By: Kreg Shropshire M.D.   On: 03/28/2021 00:08    Procedures Procedures   Medications Ordered in ED Medications  fentaNYL (SUBLIMAZE) injection 50 mcg (50 mcg Intravenous Given 03/27/21 1630)  oxyCODONE-acetaminophen (PERCOCET/ROXICET) 5-325 MG per tablet  1 tablet (1 tablet Oral Given 03/27/21 1807)  ondansetron (ZOFRAN) injection 4 mg (4 mg Intravenous Given 03/27/21 1807)    ED Course  I have reviewed the triage vital signs and the nursing notes.  Pertinent labs & imaging results that were available during my care of the patient were reviewed by me and considered in my medical decision making (see chart for details).    MDM Rules/Calculators/A&P                           65 year old female presents after mechanical fall with injury to the right ankle, on exam she has swelling and deformity to the right ankle with some tenderness along the fifth metatarsal as well.  No other traumatic injuries from fall, no head injury and not on any blood thinners.  No tenderness to the hip or ankle.  Neurovascularly intact.  We will get x-rays of the right ankle and foot.  Pain control given.  I have personally reviewed imaging. X-rays of the right ankle with trimalleolar ankle fracture fortunately this is not significantly displaced and ankle mortise is intact. Right foot films unremarkable  Patient placed in posterior short leg splint with stirrup and given crutches, case discussed with Dr. Ave Filter with orthopedics who recommends crutches, nonweightbearing and elevation, and follow-up with Dr. Susa Simmonds with orthopedics on Monday.  I discussed this plan with patient and her husband at bedside and they expressed understanding and agreement.  Will prescribe Percocet for pain and Zofran as needed for nausea.  Stressed the importance of ice and elevation and educated patient on appropriate return precautions and splint care.  She expresses understanding and agreement.  Discharged home in good condition.  Final Clinical Impression(s) / ED Diagnoses Final diagnoses:  Closed trimalleolar fracture of right ankle, initial encounter    Rx / DC Orders ED Discharge Orders          Ordered    oxyCODONE-acetaminophen (PERCOCET) 5-325 MG tablet  Every 6 hours PRN         03/27/21 1818    ondansetron (ZOFRAN ODT) 4 MG disintegrating tablet  03/27/21 1818             Dartha Lodge, PA-C 03/28/21 Lorin Picket    Ernie Avena, MD 03/28/21 2316

## 2021-03-27 NOTE — Progress Notes (Signed)
Orthopedic Tech Progress Note Patient Details:  MARICA TRENTHAM 01-26-1956 [id number]  Ortho Devices Type of Ortho Device: Ace wrap, Stirrup splint, Short leg splint, Crutches Ortho Device/Splint Location: right Ortho Device/Splint Interventions: Application   Post Interventions Patient Tolerated: Well Instructions Provided: Care of device  Saul Fordyce 03/27/2021, 5:45 PM

## 2021-03-27 NOTE — Discharge Instructions (Addendum)
You have a trimalleolar fracture to your right ankle.  Do not put any weight on the ankle use crutches as needed to get around, elevate the foot and ankle is much as possible and apply ice  Remain in splint and keep this clean and dry until you follow-up with orthopedics  For pain management you can take Tylenol 650 mg every 6 hours, Percocet every 6 hours as needed for severe pain and Zofran as needed for nausea.  Please call to schedule appointment with Dr. Susa Simmonds with orthopedics for follow-up appointment on Monday.  If you develop significantly worsening pain, develop numbness or discoloration of your toes your splint may be too tight, you should return to the hospital for reevaluation.

## 2021-03-27 NOTE — ED Triage Notes (Signed)
Pt reports slip and fall today, heard a pop to right ankle. Unable to bear weight, + pedal pulse. Ems admin 100 mcg of fentanyl

## 2021-03-31 ENCOUNTER — Other Ambulatory Visit: Payer: Self-pay

## 2021-03-31 ENCOUNTER — Other Ambulatory Visit: Payer: Self-pay | Admitting: Orthopedic Surgery

## 2021-03-31 ENCOUNTER — Ambulatory Visit
Admission: RE | Admit: 2021-03-31 | Discharge: 2021-03-31 | Disposition: A | Discharge status: Home / Self Care | Payer: Commercial Managed Care - PPO | Source: Ambulatory Visit | Attending: Orthopedic Surgery | Admitting: Orthopedic Surgery

## 2021-03-31 DIAGNOSIS — M25571 Pain in right ankle and joints of right foot: Secondary | ICD-10-CM

## 2021-03-31 NOTE — Progress Notes (Signed)
Pt. Needs orders for upcoming surgery.PAT and labs: 04/01/21.

## 2021-04-01 ENCOUNTER — Encounter (HOSPITAL_COMMUNITY): Payer: Self-pay

## 2021-04-01 ENCOUNTER — Encounter (HOSPITAL_COMMUNITY)
Admission: RE | Admit: 2021-04-01 | Discharge: 2021-04-01 | Disposition: A | Discharge status: Home / Self Care | Payer: Commercial Managed Care - PPO | Source: Ambulatory Visit | Attending: Orthopedic Surgery | Admitting: Orthopedic Surgery

## 2021-04-01 ENCOUNTER — Other Ambulatory Visit: Payer: Self-pay

## 2021-04-01 HISTORY — DX: Other specified postprocedural states: Z98.890

## 2021-04-01 HISTORY — DX: Unspecified osteoarthritis, unspecified site: M19.90

## 2021-04-01 HISTORY — DX: Other complications of anesthesia, initial encounter: T88.59XA

## 2021-04-01 HISTORY — DX: Nausea with vomiting, unspecified: R11.2

## 2021-04-01 NOTE — Progress Notes (Signed)
COVID Vaccine Completed: Yes Date COVID Vaccine completed: 12/20/2019 x 2 COVID vaccine manufacturer:    Moderna     PCP - Horton Marshall: PA Cardiologist -   Chest x-ray -  EKG - 02/09/21 EPIC Stress Test -  ECHO -  Cardiac Cath -  Pacemaker/ICD device last checked:  Sleep Study -  CPAP -   Fasting Blood Sugar -  Checks Blood Sugar _____ times a day  Blood Thinner Instructions: Aspirin Instructions: Last Dose:  Anesthesia review:   Patient denies shortness of breath, fever, cough and chest pain at PAT appointment   Patient verbalized understanding of instructions that were given to them at the PAT appointment. Patient was also instructed that they will need to review over the PAT instructions again at home before surgery.

## 2021-04-01 NOTE — Patient Instructions (Signed)
DUE TO COVID-19 ONLY ONE VISITOR IS ALLOWED TO COME WITH YOU AND STAY IN THE WAITING ROOM ONLY DURING PRE OP AND PROCEDURE.   **NO VISITORS ARE ALLOWED IN THE SHORT STAY AREA OR RECOVERY ROOM!!**  IF YOU WILL BE ADMITTED INTO THE HOSPITAL YOU ARE ALLOWED ONLY TWO SUPPORT PEOPLE DURING VISITATION HOURS ONLY (10AM -8PM)   The support person(s) may change daily. The support person(s) must pass our screening, gel in and out, and wear a mask at all times, including in the patient's room. Patients must also wear a mask when staff or their support person are in the room.  No visitors under the age of 36. Any visitor under the age of 95 must be accompanied by an adult.    COVID SWAB TESTING MUST BE COMPLETED ON:  04/02/21 **MUST PRESENT COMPLETED FORM AT TESTING SITE**    [address] [place] (backside of the building) You are not required to quarantine, however you are required to wear a well-fitted mask when you are out and around people not in your household.  Hand Hygiene often Do NOT share personal items Notify your provider if you are in close contact with someone who has COVID or you develop fever 100.4 or greater, new onset of sneezing, cough, sore throat, shortness of breath or body aches.  [facility] Entrance [address], [address], must go inside of the hospital, NOT A DRIVE THRU!  (Must self quarantine after testing. Follow instructions on handout.)       Your procedure is scheduled on: 04/04/21   Report to [facility] Main  Entrance   Report to Short Stay at 5:15 AM   [facility])   Call this number if you have problems the morning of surgery [phone]   Do not eat food :After Midnight.   May have liquids until: 4:30 AM    day of surgery  CLEAR LIQUID DIET  Foods Allowed                                                                     Foods Excluded  Water, Black Coffee and tea, regular and decaf                              liquids that you cannot  Plain Jell-O in any flavor  (No red)                                           see through such as: Fruit ices (not with fruit pulp)                                     milk, soups, orange juice              Iced Popsicles (No red)  All solid food                                   Apple juices Sports drinks like Gatorade (No red) Lightly seasoned clear broth or consume(fat free) Sugar, honey syrup  Sample Menu Breakfast                                Lunch                                     Supper Cranberry juice                    Beef broth                            Chicken broth Jell-O                                     Grape juice                           Apple juice Coffee or tea                        Jell-O                                      Popsicle                                                Coffee or tea                        Coffee or tea      Oral Hygiene is also important to reduce your risk of infection.                                    Remember - BRUSH YOUR TEETH THE MORNING OF SURGERY WITH YOUR REGULAR TOOTHPASTE   Do NOT smoke after Midnight   Take these medicines the morning of surgery with A SIP OF WATER: bupropion,omeprazole                              You may not have any metal on your body including hair pins, jewelry, and body piercing             Do not wear make-up, lotions, powders, perfumes/cologne, or deodorant  Do not wear nail polish including gel and S&S, artificial/acrylic nails, or any other type of covering on natural nails including finger and toenails. If you have artificial nails, gel coating, etc. that needs to be removed by a nail salon please have this removed prior to surgery or surgery may need to be canceled/ delayed if the surgeon/  anesthesia feels like they are unable to be safely monitored.   Do not shave  48 hours prior to surgery.    Do not  bring valuables to the hospital. [facility] IS NOT             RESPONSIBLE   FOR VALUABLES.   Contacts, dentures or bridgework may not be worn into surgery.   Bring small overnight bag day of surgery.    Patients discharged the day of surgery will not be allowed to drive home.   Special Instructions: Bring a copy of your healthcare power of attorney and living will documents         the day of surgery if you haven't scanned them in before.              Please read over the following fact sheets you were given: IF YOU HAVE QUESTIONS ABOUT YOUR PRE OP INSTRUCTIONS PLEASE CALL [phone]

## 2021-04-02 ENCOUNTER — Ambulatory Visit: Payer: Self-pay | Admitting: Physician Assistant

## 2021-04-02 ENCOUNTER — Other Ambulatory Visit: Payer: Self-pay | Admitting: Orthopedic Surgery

## 2021-04-02 NOTE — H&P (Signed)
Lori Lester is an 64 y.o. female.   Chief Complaint: R trimalleolar ankle fracture HPI: presented to office on 03/31/21 after being seen in ER following a fall on Thurs 03/27/21 and found to have a R trimalleolar ankle fracture.  She was placed into a split and here for orthopaedic evaluation and treatment recommendations.  Past Medical History:  Diagnosis Date   Arthritis    Complication of anesthesia    Diverticulitis    Hypertension    PONV (postoperative nausea and vomiting)    Tobacco abuse    Tremor 12/12/2012    Past Surgical History:  Procedure Laterality Date   ABDOMINAL HYSTERECTOMY     BACK SURGERY     CARDIAC CATHETERIZATION N/A 12/03/2015   Procedure: Left Heart Cath and Coronary Angiography;  Surgeon: Lori A Kelly, MD;  Location: [facility] INVASIVE CV LAB;  Service: Cardiovascular;  Laterality: N/A;    No family history on file. Social History:  reports that she has been smoking cigarettes. She has been smoking an average of 1 pack per day. She has never used smokeless tobacco. She reports that she does not drink alcohol and does not use drugs.  Allergies:  Allergies  Allergen Reactions   Codeine Nausea And Vomiting   Sulfa Antibiotics Nausea And Vomiting    (Not in a hospital admission)   No results found for this or any previous visit (from the past 48 hour(s)). No results found.  Review of Systems  Musculoskeletal:  Positive for arthralgias and joint swelling.  All other systems reviewed and are negative.  There were no vitals taken for this visit. Physical Exam Constitutional:      General: She is not in acute distress.    Appearance: Normal appearance.  HENT:     Head: Normocephalic and atraumatic.  Eyes:     Extraocular Movements: Extraocular movements intact.     Pupils: Pupils are equal, round, and reactive to light.  Cardiovascular:     Rate and Rhythm: Normal rate.     Pulses: Normal pulses.  Pulmonary:     Effort: Pulmonary effort is  normal. No respiratory distress.  Abdominal:     General: Abdomen is flat. There is no distension.  Musculoskeletal:     Cervical back: Normal range of motion.     Right ankle: Swelling and ecchymosis present. No deformity. Tenderness present. Decreased range of motion.  Lymphadenopathy:     Cervical: No cervical adenopathy.  Skin:    General: Skin is warm and dry.     Findings: No erythema or rash.  Neurological:     General: No focal deficit present.     Mental Status: She is alert and oriented to person, place, and time.  Psychiatric:        Mood and Affect: Mood normal.        Behavior: Behavior normal.     Assessment/Plan R trimal ankle fracture  CT scan was obtained for more detailed surgical planning.  Risks and benefits of ORIF R ankle were discussed, do not feel the posterior piece will require fixation but will need medial and lateral fixation, pt wishes to proceed and will set up on an outpt basis as soon as possible.    Lori Mechling, PA-C 04/02/2021, 7:54 PM    

## 2021-04-02 NOTE — H&P (View-Only) (Signed)
Lori Lester is an 65 y.o. female.   Chief Complaint: R trimalleolar ankle fracture HPI: presented to office on 03/31/21 after being seen in ER following a fall on Thurs 03/27/21 and found to have a R trimalleolar ankle fracture.  She was placed into a split and here for orthopaedic evaluation and treatment recommendations.  Past Medical History:  Diagnosis Date   Arthritis    Complication of anesthesia    Diverticulitis    Hypertension    PONV (postoperative nausea and vomiting)    Tobacco abuse    Tremor 12/12/2012    Past Surgical History:  Procedure Laterality Date   ABDOMINAL HYSTERECTOMY     BACK SURGERY     CARDIAC CATHETERIZATION N/A 12/03/2015   Procedure: Left Heart Cath and Coronary Angiography;  Surgeon: Lennette Bihari, MD;  Location: [facility] INVASIVE CV LAB;  Service: Cardiovascular;  Laterality: N/A;    No family history on file. Social History:  reports that she has been smoking cigarettes. She has been smoking an average of 1 pack per day. She has never used smokeless tobacco. She reports that she does not drink alcohol and does not use drugs.  Allergies:  Allergies  Allergen Reactions   Codeine Nausea And Vomiting   Sulfa Antibiotics Nausea And Vomiting    (Not in a hospital admission)   No results found for this or any previous visit (from the past 48 hour(s)). No results found.  Review of Systems  Musculoskeletal:  Positive for arthralgias and joint swelling.  All other systems reviewed and are negative.  There were no vitals taken for this visit. Physical Exam Constitutional:      General: She is not in acute distress.    Appearance: Normal appearance.  HENT:     Head: Normocephalic and atraumatic.  Eyes:     Extraocular Movements: Extraocular movements intact.     Pupils: Pupils are equal, round, and reactive to light.  Cardiovascular:     Rate and Rhythm: Normal rate.     Pulses: Normal pulses.  Pulmonary:     Effort: Pulmonary effort is  normal. No respiratory distress.  Abdominal:     General: Abdomen is flat. There is no distension.  Musculoskeletal:     Cervical back: Normal range of motion.     Right ankle: Swelling and ecchymosis present. No deformity. Tenderness present. Decreased range of motion.  Lymphadenopathy:     Cervical: No cervical adenopathy.  Skin:    General: Skin is warm and dry.     Findings: No erythema or rash.  Neurological:     General: No focal deficit present.     Mental Status: She is alert and oriented to person, place, and time.  Psychiatric:        Mood and Affect: Mood normal.        Behavior: Behavior normal.     Assessment/Plan R trimal ankle fracture  CT scan was obtained for more detailed surgical planning.  Risks and benefits of ORIF R ankle were discussed, do not feel the posterior piece will require fixation but will need medial and lateral fixation, pt wishes to proceed and will set up on an outpt basis as soon as possible.    Margart Sickles, PA-C 04/02/2021, 7:54 PM

## 2021-04-03 LAB — SARS CORONAVIRUS 2 (TAT 6-24 HRS): SARS Coronavirus 2: NEGATIVE

## 2021-04-03 NOTE — Anesthesia Preprocedure Evaluation (Addendum)
Anesthesia Evaluation  Patient identified by MRN, date of birth, ID band Patient awake    Reviewed: Allergy & Precautions, NPO status , Patient's Chart, lab work & pertinent test results  History of Anesthesia Complications (+) PONV  Airway Mallampati: II  TM Distance: >3 FB Neck ROM: Full    Dental  (+) Partial Upper   Pulmonary neg pulmonary ROS, Current Smoker,    Pulmonary exam normal        Cardiovascular hypertension,  Rhythm:Regular Rate:Normal     Neuro/Psych Anxiety negative neurological ROS     GI/Hepatic Neg liver ROS, GERD  Medicated,Diverticulitis    Endo/Other  negative endocrine ROS  Renal/GU negative Renal ROS  negative genitourinary   Musculoskeletal Right ankle fracture    Abdominal (+)  Abdomen: soft. Bowel sounds: normal.  Peds  Hematology negative hematology ROS (+)   Anesthesia Other Findings   Reproductive/Obstetrics                           Anesthesia Physical Anesthesia Plan  ASA: 2  Anesthesia Plan: General and Regional   Post-op Pain Management:  Regional for Post-op pain   Induction: Intravenous  PONV Risk Score and Plan: 3 and Ondansetron, Aprepitant, Dexamethasone, Midazolam, Scopolamine patch - Pre-op and Treatment may vary due to age or medical condition  Airway Management Planned: Mask and LMA  Additional Equipment: None  Intra-op Plan:   Post-operative Plan: Extubation in OR  Informed Consent: I have reviewed the patients History and Physical, chart, labs and discussed the procedure including the risks, benefits and alternatives for the proposed anesthesia with the patient or authorized representative who has indicated his/her understanding and acceptance.     Dental advisory given  Plan Discussed with: CRNA  Anesthesia Plan Comments: (Lab Results      Component                Value               Date                      WBC                       10.3                02/07/2021                HGB                      13.0                02/07/2021                HCT                      40.2                02/07/2021                MCV                      93.3                02/07/2021                PLT  232                 02/07/2021           Lab Results      Component                Value               Date                      NA                       140                 02/07/2021                K                        3.7                 02/07/2021                CO2                      25                  02/07/2021                GLUCOSE                  116 (H)             02/07/2021                BUN                      13                  02/07/2021                CREATININE               0.86                02/07/2021                CALCIUM                  9.1                 02/07/2021                GFRNONAA                 >60                 02/07/2021                GFRAA                    >60                 12/03/2015          )       Anesthesia Quick Evaluation

## 2021-04-04 ENCOUNTER — Ambulatory Visit (HOSPITAL_COMMUNITY)
Admission: RE | Admit: 2021-04-04 | Discharge: 2021-04-05 | Disposition: A | Discharge status: Home / Self Care | Payer: Commercial Managed Care - PPO | Attending: Orthopedic Surgery | Admitting: Orthopedic Surgery

## 2021-04-04 ENCOUNTER — Other Ambulatory Visit: Payer: Self-pay

## 2021-04-04 ENCOUNTER — Encounter (HOSPITAL_COMMUNITY): Payer: Self-pay | Admitting: Orthopedic Surgery

## 2021-04-04 ENCOUNTER — Ambulatory Visit (HOSPITAL_COMMUNITY): Payer: Commercial Managed Care - PPO | Admitting: Anesthesiology

## 2021-04-04 ENCOUNTER — Encounter (HOSPITAL_COMMUNITY)
Admission: RE | Disposition: A | Discharge status: Home / Self Care | Payer: Self-pay | Source: Home / Self Care | Attending: Orthopedic Surgery

## 2021-04-04 DIAGNOSIS — F17210 Nicotine dependence, cigarettes, uncomplicated: Secondary | ICD-10-CM | POA: Insufficient documentation

## 2021-04-04 DIAGNOSIS — Z885 Allergy status to narcotic agent status: Secondary | ICD-10-CM | POA: Insufficient documentation

## 2021-04-04 DIAGNOSIS — W19XXXA Unspecified fall, initial encounter: Secondary | ICD-10-CM | POA: Insufficient documentation

## 2021-04-04 DIAGNOSIS — Z882 Allergy status to sulfonamides status: Secondary | ICD-10-CM | POA: Insufficient documentation

## 2021-04-04 DIAGNOSIS — Z8781 Personal history of (healed) traumatic fracture: Secondary | ICD-10-CM

## 2021-04-04 DIAGNOSIS — S82851A Displaced trimalleolar fracture of right lower leg, initial encounter for closed fracture: Secondary | ICD-10-CM | POA: Insufficient documentation

## 2021-04-04 DIAGNOSIS — Z9889 Other specified postprocedural states: Secondary | ICD-10-CM

## 2021-04-04 HISTORY — PX: ORIF ANKLE FRACTURE: SHX5408

## 2021-04-04 LAB — CBC
HCT: 38.7 % (ref 36.0–46.0)
Hemoglobin: 12.6 g/dL (ref 12.0–15.0)
MCH: 30.5 pg (ref 26.0–34.0)
MCHC: 32.6 g/dL (ref 30.0–36.0)
MCV: 93.7 fL (ref 80.0–100.0)
Platelets: 239 10*3/uL (ref 150–400)
RBC: 4.13 MIL/uL (ref 3.87–5.11)
RDW: 14.1 % (ref 11.5–15.5)
WBC: 7.2 10*3/uL (ref 4.0–10.5)
nRBC: 0 % (ref 0.0–0.2)

## 2021-04-04 SURGERY — OPEN REDUCTION INTERNAL FIXATION (ORIF) ANKLE FRACTURE
Anesthesia: Regional | Site: Ankle | Laterality: Right

## 2021-04-04 MED ORDER — PROPOFOL 10 MG/ML IV BOLUS
INTRAVENOUS | Status: AC
  Filled 2021-04-04: qty 20

## 2021-04-04 MED ORDER — ASPIRIN EC 81 MG PO TBEC: 81.0000 mg | DELAYED_RELEASE_TABLET | Freq: Two times a day (BID) | ORAL | 0 refills | Status: AC

## 2021-04-04 MED ORDER — FENTANYL CITRATE (PF) 250 MCG/5ML IJ SOLN
INTRAMUSCULAR | Status: AC
  Filled 2021-04-04: qty 5

## 2021-04-04 MED ORDER — ONDANSETRON HCL 4 MG/2ML IJ SOLN
INTRAMUSCULAR | Status: DC | PRN
  Administered 2021-04-04: 4 mg via INTRAVENOUS

## 2021-04-04 MED ORDER — ACETAMINOPHEN 325 MG PO TABS
325.0000 mg | ORAL_TABLET | Freq: Four times a day (QID) | ORAL | Status: DC | PRN
  Administered 2021-04-05: 650 mg via ORAL
  Filled 2021-04-04: qty 2

## 2021-04-04 MED ORDER — POLYETHYLENE GLYCOL 3350 17 G PO PACK
17.0000 g | PACK | Freq: Every day | ORAL | Status: DC | PRN
  Filled 2021-04-04: qty 1

## 2021-04-04 MED ORDER — BUPIVACAINE HCL (PF) 0.25 % IJ SOLN
INTRAMUSCULAR | Status: DC | PRN
  Administered 2021-04-04: 10 mL

## 2021-04-04 MED ORDER — DOCUSATE SODIUM 100 MG PO CAPS
100.0000 mg | ORAL_CAPSULE | Freq: Two times a day (BID) | ORAL | Status: DC
  Administered 2021-04-04 – 2021-04-05 (×2): 100 mg via ORAL
  Filled 2021-04-04 (×2): qty 1

## 2021-04-04 MED ORDER — DEXAMETHASONE SODIUM PHOSPHATE 10 MG/ML IJ SOLN
INTRAMUSCULAR | Status: DC | PRN
  Administered 2021-04-04: 4 mg via INTRAVENOUS

## 2021-04-04 MED ORDER — PHENYLEPHRINE 40 MCG/ML (10ML) SYRINGE FOR IV PUSH (FOR BLOOD PRESSURE SUPPORT)
PREFILLED_SYRINGE | INTRAVENOUS | Status: DC | PRN
Start: 2021-04-04 — End: 2021-04-04
  Administered 2021-04-04 (×2): 80 ug via INTRAVENOUS

## 2021-04-04 MED ORDER — BUPIVACAINE HCL 0.25 % IJ SOLN
INTRAMUSCULAR | Status: AC
  Filled 2021-04-04: qty 1

## 2021-04-04 MED ORDER — LIDOCAINE 2% (20 MG/ML) 5 ML SYRINGE
INTRAMUSCULAR | Status: DC | PRN
  Administered 2021-04-04: 20 mg via INTRAVENOUS
  Administered 2021-04-04: 60 mg via INTRAVENOUS

## 2021-04-04 MED ORDER — SORBITOL 70 % SOLN
30.0000 mL | Freq: Every day | Status: DC | PRN
  Filled 2021-04-04: qty 30

## 2021-04-04 MED ORDER — SODIUM CHLORIDE 0.9 % IV SOLN: INTRAVENOUS | Status: DC

## 2021-04-04 MED ORDER — FENTANYL CITRATE (PF) 100 MCG/2ML IJ SOLN
INTRAMUSCULAR | Status: DC | PRN
  Administered 2021-04-04: 100 ug via INTRAVENOUS

## 2021-04-04 MED ORDER — APREPITANT 40 MG PO CAPS
40.0000 mg | ORAL_CAPSULE | Freq: Once | ORAL | Status: AC
  Administered 2021-04-04: 40 mg via ORAL
  Filled 2021-04-04: qty 1

## 2021-04-04 MED ORDER — PROPOFOL 10 MG/ML IV BOLUS
INTRAVENOUS | Status: DC | PRN
  Administered 2021-04-04: 175 mg via INTRAVENOUS

## 2021-04-04 MED ORDER — ALPRAZOLAM 0.25 MG PO TABS
0.2500 mg | ORAL_TABLET | Freq: Every day | ORAL | Status: DC | PRN
  Administered 2021-04-04: 0.25 mg via ORAL
  Filled 2021-04-04: qty 1

## 2021-04-04 MED ORDER — DULOXETINE HCL 60 MG PO CPEP
60.0000 mg | ORAL_CAPSULE | Freq: Every morning | ORAL | Status: DC
  Administered 2021-04-04 – 2021-04-05 (×2): 60 mg via ORAL
  Filled 2021-04-04 (×2): qty 1

## 2021-04-04 MED ORDER — DEXAMETHASONE SODIUM PHOSPHATE 10 MG/ML IJ SOLN
INTRAMUSCULAR | Status: DC | PRN
  Administered 2021-04-04: 10 mg

## 2021-04-04 MED ORDER — CHLORHEXIDINE GLUCONATE 0.12 % MT SOLN
15.0000 mL | Freq: Once | OROMUCOSAL | Status: AC
  Administered 2021-04-04: 15 mL via OROMUCOSAL

## 2021-04-04 MED ORDER — ONDANSETRON HCL 4 MG/2ML IJ SOLN
INTRAMUSCULAR | Status: AC
  Filled 2021-04-04: qty 2

## 2021-04-04 MED ORDER — MIDAZOLAM HCL 5 MG/5ML IJ SOLN
INTRAMUSCULAR | Status: DC | PRN
  Administered 2021-04-04: 2 mg via INTRAVENOUS

## 2021-04-04 MED ORDER — CEFAZOLIN SODIUM-DEXTROSE 1-4 GM/50ML-% IV SOLN
1.0000 g | Freq: Four times a day (QID) | INTRAVENOUS | Status: AC
  Administered 2021-04-04 – 2021-04-05 (×3): 1 g via INTRAVENOUS
  Filled 2021-04-04 (×3): qty 50

## 2021-04-04 MED ORDER — ROCURONIUM BROMIDE 10 MG/ML (PF) SYRINGE
PREFILLED_SYRINGE | INTRAVENOUS | Status: AC
  Filled 2021-04-04: qty 20

## 2021-04-04 MED ORDER — AMISULPRIDE (ANTIEMETIC) 5 MG/2ML IV SOLN
10.0000 mg | Freq: Once | INTRAVENOUS | Status: AC | PRN
Start: 2021-04-04 — End: 2021-04-04
  Administered 2021-04-04: 10 mg via INTRAVENOUS

## 2021-04-04 MED ORDER — SCOPOLAMINE 1 MG/3DAYS TD PT72
1.0000 | MEDICATED_PATCH | TRANSDERMAL | Status: DC
  Administered 2021-04-04: 1.5 mg via TRANSDERMAL
  Filled 2021-04-04: qty 1

## 2021-04-04 MED ORDER — HYDROCODONE-ACETAMINOPHEN 5-325 MG PO TABS: 1.0000 | ORAL_TABLET | ORAL | Status: DC | PRN

## 2021-04-04 MED ORDER — ACETAMINOPHEN 10 MG/ML IV SOLN: 1000.0000 mg | Freq: Once | INTRAVENOUS | Status: DC | PRN

## 2021-04-04 MED ORDER — CEFAZOLIN SODIUM-DEXTROSE 2-4 GM/100ML-% IV SOLN
2.0000 g | INTRAVENOUS | Status: AC
  Administered 2021-04-04: 2 g via INTRAVENOUS
  Filled 2021-04-04: qty 100

## 2021-04-04 MED ORDER — LACTATED RINGERS IV SOLN: INTRAVENOUS | Status: DC

## 2021-04-04 MED ORDER — HYDROCODONE-ACETAMINOPHEN 5-325 MG PO TABS: 1.0000 | ORAL_TABLET | ORAL | 0 refills | Status: DC | PRN

## 2021-04-04 MED ORDER — FLEET ENEMA 7-19 GM/118ML RE ENEM: 1.0000 | ENEMA | Freq: Once | RECTAL | Status: DC | PRN

## 2021-04-04 MED ORDER — MORPHINE SULFATE (PF) 2 MG/ML IV SOLN: 0.5000 mg | INTRAVENOUS | Status: DC | PRN

## 2021-04-04 MED ORDER — ORAL CARE MOUTH RINSE: 15.0000 mL | Freq: Once | OROMUCOSAL | Status: AC

## 2021-04-04 MED ORDER — AMISULPRIDE (ANTIEMETIC) 5 MG/2ML IV SOLN
INTRAVENOUS | Status: AC
  Filled 2021-04-04: qty 4

## 2021-04-04 MED ORDER — PROMETHAZINE HCL 25 MG PO TABS
12.5000 mg | ORAL_TABLET | Freq: Four times a day (QID) | ORAL | Status: DC | PRN
  Administered 2021-04-04: 12.5 mg via ORAL
  Filled 2021-04-04: qty 1

## 2021-04-04 MED ORDER — 0.9 % SODIUM CHLORIDE (POUR BTL) OPTIME
TOPICAL | Status: DC | PRN
  Administered 2021-04-04: 1000 mL

## 2021-04-04 MED ORDER — PROPOFOL 500 MG/50ML IV EMUL
INTRAVENOUS | Status: DC | PRN
Start: 2021-04-04 — End: 2021-04-04
  Administered 2021-04-04: 25 ug/kg/min via INTRAVENOUS

## 2021-04-04 MED ORDER — PANTOPRAZOLE SODIUM 40 MG PO TBEC
40.0000 mg | DELAYED_RELEASE_TABLET | Freq: Every day | ORAL | Status: DC
  Administered 2021-04-04 – 2021-04-05 (×2): 40 mg via ORAL
  Filled 2021-04-04 (×2): qty 1

## 2021-04-04 MED ORDER — FENTANYL CITRATE (PF) 100 MCG/2ML IJ SOLN: 25.0000 ug | INTRAMUSCULAR | Status: DC | PRN

## 2021-04-04 MED ORDER — ROPIVACAINE HCL 5 MG/ML IJ SOLN
INTRAMUSCULAR | Status: DC | PRN
  Administered 2021-04-04: 30 mL via PERINEURAL

## 2021-04-04 MED ORDER — MIDAZOLAM HCL 2 MG/2ML IJ SOLN
INTRAMUSCULAR | Status: AC
  Filled 2021-04-04: qty 2

## 2021-04-04 MED ORDER — BUPROPION HCL ER (XL) 150 MG PO TB24
150.0000 mg | ORAL_TABLET | Freq: Every day | ORAL | Status: DC
  Administered 2021-04-04 – 2021-04-05 (×2): 150 mg via ORAL
  Filled 2021-04-04 (×2): qty 1

## 2021-04-04 SURGICAL SUPPLY — 59 items
BAG SPEC THK2 15X12 ZIP CLS (MISCELLANEOUS) ×1
BAG ZIPLOCK 12X15 (MISCELLANEOUS) ×2 IMPLANT
BIT DRILL 2 CANN GRADUATED (BIT) ×1 IMPLANT
BIT DRILL 2.5 CANN STRL (BIT) ×1 IMPLANT
BIT DRILL 2.6 CANN (BIT) ×1 IMPLANT
BNDG ELASTIC 4X5.8 VLCR STR LF (GAUZE/BANDAGES/DRESSINGS) ×1 IMPLANT
BNDG ELASTIC 6X5.8 VLCR STR LF (GAUZE/BANDAGES/DRESSINGS) ×2 IMPLANT
COVER SURGICAL LIGHT HANDLE (MISCELLANEOUS) ×2 IMPLANT
CUFF TOURN SGL QUICK 34 (TOURNIQUET CUFF) ×2
CUFF TRNQT CYL 34X4.125X (TOURNIQUET CUFF) ×1 IMPLANT
DRAPE OEC MINIVIEW 54X84 (DRAPES) ×1 IMPLANT
DRAPE U-SHAPE 47X51 STRL (DRAPES) ×2 IMPLANT
DRSG ADAPTIC 3X8 NADH LF (GAUZE/BANDAGES/DRESSINGS) ×2 IMPLANT
DRSG PAD ABDOMINAL 8X10 ST (GAUZE/BANDAGES/DRESSINGS) ×2 IMPLANT
ELECT REM PT RETURN 15FT ADLT (MISCELLANEOUS) ×2 IMPLANT
GAUZE SPONGE 4X4 12PLY STRL (GAUZE/BANDAGES/DRESSINGS) ×2 IMPLANT
GLOVE SURG ENC MOIS LTX SZ7.5 (GLOVE) ×3 IMPLANT
GLOVE SURG ENC MOIS LTX SZ8 (GLOVE) ×1 IMPLANT
GLOVE SURG ORTHO LTX SZ8 (GLOVE) ×2 IMPLANT
GLOVE SURG UNDER POLY LF SZ7 (GLOVE) ×1 IMPLANT
GOWN STRL REUS W/TWL LRG LVL3 (GOWN DISPOSABLE) ×1 IMPLANT
GOWN STRL REUS W/TWL XL LVL3 (GOWN DISPOSABLE) ×4 IMPLANT
GUIDEWIRE 1.35MM (WIRE) ×2 IMPLANT
K-WIRE BB-TAK (WIRE) ×4
KIT TURNOVER KIT A (KITS) ×2 IMPLANT
KWIRE BB-TAK (WIRE) IMPLANT
MANIFOLD NEPTUNE II (INSTRUMENTS) ×2 IMPLANT
NDL HYPO 25X1 1.5 SAFETY (NEEDLE) IMPLANT
NEEDLE HYPO 25X1 1.5 SAFETY (NEEDLE) ×2 IMPLANT
NS IRRIG 1000ML POUR BTL (IV SOLUTION) ×1 IMPLANT
PACK ORTHO EXTREMITY (CUSTOM PROCEDURE TRAY) ×2 IMPLANT
PAD CAST 4YDX4 CTTN HI CHSV (CAST SUPPLIES) ×2 IMPLANT
PADDING CAST COTTON 4X4 STRL (CAST SUPPLIES) ×2
PLATE DIST FIB 8H (Plate) ×1 IMPLANT
PROTECTOR NERVE ULNAR (MISCELLANEOUS) ×2 IMPLANT
SCREW CANCELLOUS 3X16MM (Screw) ×1 IMPLANT
SCREW CANN 4.0X50 (Screw) ×4 IMPLANT
SCREW CANN T15 ST 50X4 ST (Screw) IMPLANT
SCREW LOCK T10 FT 18X2.7X (Screw) IMPLANT
SCREW LOCKING 2.7X16MM (Screw) ×1 IMPLANT
SCREW LOCKING 2.7X18MM (Screw) ×2 IMPLANT
SCREW LOCKING LP 2.7X20MM (Screw) ×1 IMPLANT
SCREW LOW PROFILE 3.5X14 (Screw) ×2 IMPLANT
SCREW LOW PROFILE 3.5X16 (Screw) ×2 IMPLANT
SCREW NON-LOCKING 3.5X20 ANKLE (Screw) ×1 IMPLANT
SPLINT PLASTER CAST XFAST 5X30 (CAST SUPPLIES) IMPLANT
SPLINT PLASTER XFAST SET 5X30 (CAST SUPPLIES) ×2
STRIP CLOSURE SKIN 1/2X4 (GAUZE/BANDAGES/DRESSINGS) ×2 IMPLANT
SUCTION FRAZIER HANDLE 10FR (MISCELLANEOUS) ×2
SUCTION TUBE FRAZIER 10FR DISP (MISCELLANEOUS) IMPLANT
SUT ETHILON 3 0 PS 1 (SUTURE) ×2 IMPLANT
SUT MNCRL AB 4-0 PS2 18 (SUTURE) ×2 IMPLANT
SUT VIC AB 1 CT1 27 (SUTURE) ×4
SUT VIC AB 1 CT1 27XBRD ANTBC (SUTURE) ×2 IMPLANT
SUT VIC AB 2-0 CT1 27 (SUTURE) ×2
SUT VIC AB 2-0 CT1 TAPERPNT 27 (SUTURE) ×1 IMPLANT
SYR CONTROL 10ML LL (SYRINGE) ×1 IMPLANT
TOWEL OR 17X26 10 PK STRL BLUE (TOWEL DISPOSABLE) ×4 IMPLANT
YANKAUER SUCT BULB TIP 10FT TU (MISCELLANEOUS) ×1 IMPLANT

## 2021-04-04 NOTE — Anesthesia Procedure Notes (Signed)
Anesthesia Regional Block: Popliteal block   Pre-Anesthetic Checklist: , timeout performed,  Correct Patient, Correct Site, Correct Laterality,  Correct Procedure, Correct Position, site marked,  Risks and benefits discussed,  Surgical consent,  Pre-op evaluation,  At surgeon's request and post-op pain management  Laterality: Right  Prep: Dura Prep       Needles:  Injection technique: Single-shot  Needle Type: Echogenic Stimulator Needle     Needle Length: 10cm  Needle Gauge: 20     Additional Needles:   Procedures:,,,, ultrasound used (permanent image in chart),,    Narrative:  Start time: 04/04/2021 7:03 AM End time: 04/04/2021 7:06 AM Injection made incrementally with aspirations every 5 mL.  Performed by: Personally  Anesthesiologist: Atilano Median, DO  Additional Notes: Patient identified. Risks/Benefits/Options discussed with patient including but not limited to bleeding, infection, nerve damage, failed block, incomplete pain control. Patient expressed understanding and wished to proceed. All questions were answered. Sterile technique was used throughout the entire procedure. Please see nursing notes for vital signs. Aspirated in 5cc intervals with injection for negative confirmation. Patient was given instructions on fall risk and not to get out of bed. All questions and concerns addressed with instructions to call with any issues or inadequate analgesia.

## 2021-04-04 NOTE — Op Note (Signed)
NAME: Lori Lester, Lori Lester MEDICAL RECORD NO: [id number] ACCOUNT NO: [id number] DATE OF BIRTH: 06/26/1956 FACILITY: [facility] LOCATION: [facility] PHYSICIAN: W D. Carloyn Manner., MD  Operative Report   DATE OF PROCEDURE: 04/04/2021  PREOPERATIVE DIAGNOSIS:  Displaced trimalleolar ankle fracture, right.  POSTOPERATIVE DIAGNOSIS:  Displaced trimalleolar ankle fracture, right.  PROCEDURE:  Open reduction internal fixation, trimalleolar ankle fracture.  SURGEON:  Thera Flake., MD  ASSISTANTVincent Peyer.  ANESTHESIA:  General block with local supplementation.  TOURNIQUET TIME:  Approximately 1 hour 20 minutes.  DESCRIPTION OF PROCEDURE:  Supine position.  Exsanguination of leg and inflation of the thigh tourniquet to 300.  An incision was made over the fibular fracture, which was a very comminuted Weber B fracture with some extension, though almost towards the  tip of the fibula and proximally, there were several comminuted pieces as well.  Provisional reduction was carried out followed by placement of a locking plate, stainless steel Arthrex plate.  We obtained 4 good cortical screws proximally, 1 intervening  hole was through the fracture site and distally, we were able to get 3 good locking screws to fix the distal part of the fracture, although the intervening comminuted segment was bridged with the plate.  Confirmed anatomic reduction with the C-arm.   Medial side, we used a small incision.  It had been anatomically reduced relative to after fixation of the fibula.  Percutaneously inserted two 4.0 cancellous screws into the medial malleolar fragment with good purchase and again anatomic reduction noted  with the C-arm in all 3 views of the ankle.  The posterior lip fracture which did not involve a large amount of the articular surface was well aligned.  We did not feel the need to fix the small posterior lip fracture.  Wound was irrigated.  Closure was  effected on the fibular side with Vicryl  and with nylon on the small percutaneous wound medially.  A compressive bulky dressing was applied.  We did infiltrate some local anesthetic 0.25% plain Marcaine into the skin.  Again, a posterior splint was  applied.  Tourniquet was released after application of the dressing.   SHW D: 04/04/2021 10:33:12 am T: 04/04/2021 10:46:00 am  JOB: [id number]/ [id number]

## 2021-04-04 NOTE — Anesthesia Procedure Notes (Signed)
Procedure Name: LMA Insertion Date/Time: 04/04/2021 8:47 AM Performed by: Ezekiel Ina, CRNA Pre-anesthesia Checklist: Patient identified, Emergency Drugs available, Suction available and Patient being monitored Patient Re-evaluated:Patient Re-evaluated prior to induction Oxygen Delivery Method: Circle system utilized Preoxygenation: Pre-oxygenation with 100% oxygen Induction Type: IV induction Ventilation: Mask ventilation without difficulty LMA: LMA with gastric port inserted LMA Size: 4.0 Number of attempts: 1 Tube secured with: Tape Dental Injury: Teeth and Oropharynx as per pre-operative assessment

## 2021-04-04 NOTE — Anesthesia Procedure Notes (Signed)
Anesthesia Regional Block: Adductor canal block   Pre-Anesthetic Checklist: , timeout performed,  Correct Patient, Correct Site, Correct Laterality,  Correct Procedure, Correct Position, site marked,  Risks and benefits discussed,  Surgical consent,  Pre-op evaluation,  At surgeon's request and post-op pain management  Laterality: Right  Prep: Dura Prep       Needles:  Injection technique: Single-shot  Needle Type: Echogenic Stimulator Needle     Needle Length: 10cm  Needle Gauge: 20     Additional Needles:   Procedures:,,,, ultrasound used (permanent image in chart),,    Narrative:  Start time: 04/04/2021 6:58 AM End time: 04/04/2021 7:02 AM Injection made incrementally with aspirations every 5 mL.  Performed by: Personally  Anesthesiologist: Atilano Median, DO  Additional Notes: Patient identified. Risks/Benefits/Options discussed with patient including but not limited to bleeding, infection, nerve damage, failed block, incomplete pain control. Patient expressed understanding and wished to proceed. All questions were answered. Sterile technique was used throughout the entire procedure. Please see nursing notes for vital signs. Aspirated in 5cc intervals with injection for negative confirmation. Patient was given instructions on fall risk and not to get out of bed. All questions and concerns addressed with instructions to call with any issues or inadequate analgesia.

## 2021-04-04 NOTE — Transfer of Care (Signed)
Immediate Anesthesia Transfer of Care Note  Patient: Lori Lester  Procedure(s) Performed: OPEN REDUCTION INTERNAL FIXATION (ORIF) ANKLE FRACTURE (Right: Ankle)  Patient Location: PACU  Anesthesia Type:General  Level of Consciousness: awake  Airway & Oxygen Therapy: Patient Spontanous Breathing and Patient connected to nasal cannula oxygen  Post-op Assessment: Report given to RN and Post -op Vital signs reviewed and stable  Post vital signs: Reviewed and stable  Last Vitals:  Vitals Value Taken Time  BP 119/61 04/04/21 1046  Temp 36.4 C 04/04/21 1045  Pulse 70 04/04/21 1050  Resp 24 04/04/21 1050  SpO2 91 % 04/04/21 1050  Vitals shown include unvalidated device data.  Last Pain:  Vitals:   04/04/21 0550  TempSrc: Oral         Complications: No notable events documented.

## 2021-04-04 NOTE — Brief Op Note (Signed)
04/04/2021  10:48 AM  PATIENT:  Laroy Apple  65 y.o. female  PRE-OPERATIVE DIAGNOSIS:  right ankle fracture  POST-OPERATIVE DIAGNOSIS:  right ankle fracture  PROCEDURE:  Procedure(s): OPEN REDUCTION INTERNAL FIXATION (ORIF) ANKLE FRACTURE (Right)  SURGEON:  Surgeon(s) and Role:    Frederico Hamman, MD - Primary  PHYSICIAN ASSISTANT: Margart Sickles, PA-C  ASSISTANTS:    ANESTHESIA:   local, regional, and general  EBL:  minimal   BLOOD ADMINISTERED:none  DRAINS: none   LOCAL MEDICATIONS USED:  MARCAINE     SPECIMEN:  No Specimen  DISPOSITION OF SPECIMEN:  N/A  COUNTS:  YES  TOURNIQUET:   Total Tourniquet Time Documented: Thigh (Right) - -18633 minutes Total: Thigh (Right) - -18633 minutes   DICTATION: .Other Dictation: Dictation Number unknown  PLAN OF CARE: Admit for overnight observation  PATIENT DISPOSITION:  PACU - hemodynamically stable.   Delay start of Pharmacological VTE agent (>24hrs) due to surgical blood loss or risk of bleeding: yes

## 2021-04-04 NOTE — Plan of Care (Signed)

## 2021-04-04 NOTE — Interval H&P Note (Signed)
History and Physical Interval Note:  04/04/2021 8:27 AM  Lori Lester  has presented today for surgery, with the diagnosis of right ankle fracture.  The various methods of treatment have been discussed with the patient and family. After consideration of risks, benefits and other options for treatment, the patient has consented to  Procedure(s): OPEN REDUCTION INTERNAL FIXATION (ORIF) ANKLE FRACTURE (Right) as a surgical intervention.  The patient's history has been reviewed, patient examined, no change in status, stable for surgery.  I have reviewed the patient's chart and labs.  Questions were answered to the patient's satisfaction.     Thera Flake

## 2021-04-04 NOTE — Discharge Instructions (Signed)
Diet: As you were doing prior to hospitalization   Activity: Increase activity slowly as tolerated  No lifting or driving for 6 weeks   Shower: May shower must keep splint dry  Dressing: leave splint in place until f/u.  Keep clean and dry.  Weight Bearing: nonweight bearing right leg as taught in physical therapy. Use a walker or  Crutches as instructed.   To prevent constipation: you may use a stool softener such as -  Colace ( over the counter) 100 mg by mouth twice a day  Drink plenty of fluids ( prune juice may be helpful) and high fiber foods  Miralax ( over the counter) for constipation as needed.   Precautions: If you experience chest pain or shortness of breath - call 911 immediately For transfer to the hospital emergency department!!  If you develop a fever greater that 101 F, purulent drainage from wound, increased redness or drainage from wound, or calf pain -- Call the office   Follow- Up Appointment: Please call for an appointment to be seen in 2 weeks  [place] - [phone]

## 2021-04-04 NOTE — Anesthesia Postprocedure Evaluation (Signed)
Anesthesia Post Note  Patient: Lori Lester  Procedure(s) Performed: OPEN REDUCTION INTERNAL FIXATION (ORIF) ANKLE FRACTURE (Right: Ankle)     Patient location during evaluation: PACU Anesthesia Type: Regional and General Level of consciousness: awake and alert Pain management: pain level controlled Vital Signs Assessment: post-procedure vital signs reviewed and stable Respiratory status: spontaneous breathing, nonlabored ventilation, respiratory function stable and patient connected to nasal cannula oxygen Cardiovascular status: blood pressure returned to baseline and stable Postop Assessment: no apparent nausea or vomiting Anesthetic complications: no   No notable events documented.  Last Vitals:  Vitals:   04/04/21 1145 04/04/21 1200  BP: (!) 160/75 (!) 168/78  Pulse: 87 92  Resp: 15 (!) 25  Temp:  36.6 C  SpO2: 95% 97%    Last Pain:  Vitals:   04/04/21 1200  TempSrc:   PainSc: 0-No pain                 Earl Lites P Bird Tailor

## 2021-04-05 DIAGNOSIS — S82851A Displaced trimalleolar fracture of right lower leg, initial encounter for closed fracture: Secondary | ICD-10-CM | POA: Diagnosis not present

## 2021-04-05 NOTE — Plan of Care (Signed)
Patient dc'd  

## 2021-04-05 NOTE — Plan of Care (Signed)
  Problem: Education: Goal: Knowledge of General Education information will improve Description: Including pain rating scale, medication(s)/side effects and non-pharmacologic comfort measures Outcome: Progressing   Problem: Activity: Goal: Risk for activity intolerance will decrease Outcome: Progressing   Problem: Pain Managment: Goal: General experience of comfort will improve Outcome: Progressing   

## 2021-04-05 NOTE — Evaluation (Signed)
Physical Therapy Evaluation Patient Details Name: Lori Lester MRN: [id number] DOB: [birth year] Today's Date: 04/05/2021   History of Present Illness  65 yo female admitted after  a fall on  03/27/21, initially placed in splint for  R trimalleolar ankle fracture. s/p R ankle ORIF on 04/04/21 pe Dr French Ana. PMH: HTN, back surgery  Clinical Impression  Patient evaluated by Physical Therapy with no further acute PT needs identified. All education has been completed and the patient has no further questions.  See below for areas reviewed. Pt has all DME, reviewed importance of NWB/healing, elevation, ice etc. F/u PT will be per MD .   See below for any follow-up Physical Therapy or equipment needs. PT is signing off. Thank you for this referral.     Follow Up Recommendations Other (comment) (f/u will be per MD)    Equipment Recommendations  None recommended by PT    Recommendations for Other Services       Precautions / Restrictions Precautions Precautions: Fall Restrictions RLE Weight Bearing: Non weight bearing      Mobility  Bed Mobility   Bed Mobility: Supine to Sit     Supine to sit: Modified independent (Device/Increase time)          Transfers Overall transfer level: Needs assistance Equipment used: Rolling walker (2 wheeled) Transfers: Sit to/from Stand Sit to Stand: Min guard         General transfer comment: cues for hand placement and RLE position  Ambulation/Gait Ambulation/Gait assistance: Min guard Gait Distance (Feet): 30 Feet Assistive device: Rolling walker (2 wheeled)   Gait velocity: decr   General Gait Details: able to maintain NWB, hopping on LLE with diminished eccentric control resulting in heavy wt shift on to LLE  Stairs            Wheelchair Mobility    Modified Rankin (Stroke Patients Only)       Balance Overall balance assessment: Needs assistance Sitting-balance support: No upper extremity supported;Feet  supported       Standing balance support: Bilateral upper extremity supported Standing balance-Leahy Scale: Poor Standing balance comment: reliant on UEs                             Pertinent Vitals/Pain Pain Assessment: 0-10 Pain Location: right ankle Pain Descriptors / Indicators: Sore;Aching Pain Intervention(s): Limited activity within patient's tolerance;Monitored during session;Repositioned    Home Living Family/patient expects to be discharged to:: Private residence Living Arrangements: Spouse/significant other;Non-relatives/Friends Available Help at Discharge: Family Type of Home: House Home Access: Stairs to enter Entrance Stairs-Rails: Right;Left;Can reach both Technical brewer of Steps: 5 Home Layout: One level Home Equipment: Environmental consultant - 4 wheels;Other (comment);Walker - 2 wheels;Bedside commode Additional Comments: knee scooter and access to w/c    Prior Function Level of Independence: Independent               Hand Dominance        Extremity/Trunk Assessment   Upper Extremity Assessment Upper Extremity Assessment: Overall WFL for tasks assessed    Lower Extremity Assessment Lower Extremity Assessment: RLE deficits/detail RLE Deficits / Details: moves toes, absent sensation to light touch (pt states feeling is "coming back")       Communication   Communication: No difficulties  Cognition Arousal/Alertness: Awake/alert Behavior During Therapy: WFL for tasks assessed/performed Overall Cognitive Status: Within Functional Limits for tasks assessed  General Comments General comments (skin integrity, edema, etc.): reviewed importance of elevation RLE, moves toes as tol; pt and husband verbalize technique for going up/down stairs with knee scooter and bil rails. pt and husband have done multiple times, son available to assist if needed. they feel confident in repeating this at home  today an ddo not feel th eneed to practice here in the hospital.    Exercises     Assessment/Plan    PT Assessment All further PT needs can be met in the next venue of care  PT Problem List         PT Treatment Interventions      PT Goals (Current goals can be found in the Care Plan section)  Acute Rehab PT Goals PT Goal Formulation: All assessment and education complete, DC therapy    Frequency     Barriers to discharge        Co-evaluation               AM-PAC PT "6 Clicks" Mobility  Outcome Measure Help needed turning from your back to your side while in a flat bed without using bedrails?: None Help needed moving from lying on your back to sitting on the side of a flat bed without using bedrails?: None Help needed moving to and from a bed to a chair (including a wheelchair)?: A Little Help needed standing up from a chair using your arms (e.g., wheelchair or bedside chair)?: A Little Help needed to walk in hospital room?: A Little Help needed climbing 3-5 steps with a railing? : A Little 6 Click Score: 20    End of Session Equipment Utilized During Treatment: Gait belt Activity Tolerance: Patient tolerated treatment well Patient left: in chair;with call bell/phone within reach;with chair alarm set   PT Visit Diagnosis: Other abnormalities of gait and mobility (R26.89)    Time: 3785-8850 PT Time Calculation (min) (ACUTE ONLY): 23 min   Charges:   PT Evaluation $PT Eval Low Complexity: 1 Low PT Treatments $Gait Training: 8-22 mins        Baxter Flattery, PT  Acute Rehab Dept ([place]) [phone] Pager [phone]  04/05/2021   [facility] 04/05/2021, 12:29 PM

## 2021-04-08 ENCOUNTER — Encounter (HOSPITAL_COMMUNITY): Payer: Self-pay | Admitting: Orthopedic Surgery

## 2021-09-04 ENCOUNTER — Encounter (HOSPITAL_BASED_OUTPATIENT_CLINIC_OR_DEPARTMENT_OTHER): Payer: Self-pay

## 2021-09-04 ENCOUNTER — Emergency Department (HOSPITAL_BASED_OUTPATIENT_CLINIC_OR_DEPARTMENT_OTHER): Payer: Commercial Managed Care - PPO

## 2021-09-04 ENCOUNTER — Emergency Department (HOSPITAL_BASED_OUTPATIENT_CLINIC_OR_DEPARTMENT_OTHER)
Admission: EM | Admit: 2021-09-04 | Discharge: 2021-09-04 | Disposition: A | Discharge status: Home / Self Care | Payer: Commercial Managed Care - PPO | Attending: Emergency Medicine | Admitting: Emergency Medicine

## 2021-09-04 ENCOUNTER — Other Ambulatory Visit: Payer: Self-pay

## 2021-09-04 DIAGNOSIS — R55 Syncope and collapse: Secondary | ICD-10-CM | POA: Insufficient documentation

## 2021-09-04 DIAGNOSIS — Z20822 Contact with and (suspected) exposure to covid-19: Secondary | ICD-10-CM | POA: Insufficient documentation

## 2021-09-04 DIAGNOSIS — R1031 Right lower quadrant pain: Secondary | ICD-10-CM | POA: Insufficient documentation

## 2021-09-04 DIAGNOSIS — R109 Unspecified abdominal pain: Secondary | ICD-10-CM

## 2021-09-04 LAB — URINALYSIS, ROUTINE W REFLEX MICROSCOPIC
Bilirubin Urine: NEGATIVE
Glucose, UA: NEGATIVE mg/dL
Hgb urine dipstick: NEGATIVE
Ketones, ur: NEGATIVE mg/dL
Nitrite: NEGATIVE
Protein, ur: NEGATIVE mg/dL
Specific Gravity, Urine: 1.023 (ref 1.005–1.030)
pH: 5 (ref 5.0–8.0)

## 2021-09-04 LAB — BASIC METABOLIC PANEL
Anion gap: 11 (ref 5–15)
BUN: 11 mg/dL (ref 8–23)
CO2: 24 mmol/L (ref 22–32)
Calcium: 9.2 mg/dL (ref 8.9–10.3)
Chloride: 104 mmol/L (ref 98–111)
Creatinine, Ser: 0.72 mg/dL (ref 0.44–1.00)
GFR, Estimated: >60 mL/min (ref >60)
Glucose, Bld: 94 mg/dL (ref 70–99)
Potassium: 4 mmol/L (ref 3.5–5.1)
Sodium: 139 mmol/L (ref 135–145)

## 2021-09-04 LAB — RESP PANEL BY RT-PCR (FLU A&B, COVID) ARPGX2
Influenza A by PCR: NEGATIVE
Influenza B by PCR: NEGATIVE
SARS Coronavirus 2 by RT PCR: NEGATIVE

## 2021-09-04 LAB — CBC
HCT: 40.3 % (ref 36.0–46.0)
Hemoglobin: 13 g/dL (ref 12.0–15.0)
MCH: 29.6 pg (ref 26.0–34.0)
MCHC: 32.3 g/dL (ref 30.0–36.0)
MCV: 91.8 fL (ref 80.0–100.0)
Platelets: 201 10*3/uL (ref 150–400)
RBC: 4.39 MIL/uL (ref 3.87–5.11)
RDW: 13.8 % (ref 11.5–15.5)
WBC: 7.3 10*3/uL (ref 4.0–10.5)
nRBC: 0 % (ref 0.0–0.2)

## 2021-09-04 LAB — TROPONIN I (HIGH SENSITIVITY): Troponin I (High Sensitivity): 3 ng/L (ref <18)

## 2021-09-04 MED ORDER — ONDANSETRON HCL 4 MG/2ML IJ SOLN
4.0000 mg | Freq: Once | INTRAMUSCULAR | Status: AC
  Administered 2021-09-04: 4 mg via INTRAVENOUS
  Filled 2021-09-04: qty 2

## 2021-09-04 MED ORDER — IOHEXOL 300 MG/ML  SOLN
100.0000 mL | Freq: Once | INTRAMUSCULAR | Status: AC | PRN
  Administered 2021-09-04: 100 mL via INTRAVENOUS

## 2021-09-04 MED ORDER — SODIUM CHLORIDE 0.9 % IV BOLUS
1000.0000 mL | Freq: Once | INTRAVENOUS | Status: AC
  Administered 2021-09-04: 1000 mL via INTRAVENOUS

## 2021-09-04 NOTE — ED Notes (Signed)
Dc instructions reviewed with patient. Patient voiced understanding. Dc with belongings.  °

## 2021-09-04 NOTE — ED Triage Notes (Signed)
Pt sent to ED by here PCP. Pt has been treated for UTI x 1 month. Pt has been on multiple abx. Pt reports she is having periods of nearly passing out and lightheaded feeling. Pt states it happens more often when going from sitting to standing.

## 2021-09-04 NOTE — ED Provider Notes (Signed)
[facility] EMERGENCY DEPT Provider Note   CSN: [id number] Arrival date & time: 09/04/21  1450     History Chief Complaint  Patient presents with   Near Syncope    Lori Lester is a 66 y.o. female who presents to the emergency department today with a 4-week history of urinary urgency, suprapubic abdominal pain and now right flank pain for the last week.  Patient is prone to UTIs and thought she had 1 back in December.  She was seen evaluated by her primary care doctor had a positive urinalysis at that time we will started on ciprofloxacin.  She then had to stop because she broke out into a rash.  She was then started on Keflex but did not tolerate that well either.  She then went to [facility] walk-in clinic and still had a positive urinary tract infections on UA at that time and was given a shot of some antibiotic for the patient and started on nitrofurantoin.  Over the last week, she states that she is now having right-sided flank pain and lightheadedness primarily when she changes positions.  She reports associated nausea but denies any fever, chills, cough, cold, congestion, diarrhea, hematuria.   Near Syncope      Home Medications Prior to Admission medications   Medication Sig Start Date End Date Taking? Authorizing Provider  ALPRAZolam Duanne Moron) 0.25 MG tablet Take 0.25 mg by mouth daily as needed for anxiety.    [provider]  buPROPion (WELLBUTRIN XL) 150 MG 24 hr tablet Take 150 mg by mouth daily.    [provider]  DULoxetine (CYMBALTA) 60 MG capsule Take 60 mg by mouth every morning.    [provider]  ergocalciferol (VITAMIN D2) 1.25 MG (50000 UT) capsule Take 50,000 Units by mouth every Monday.    [provider]  HYDROcodone-acetaminophen (NORCO/VICODIN) 5-325 MG tablet Take 1 tablet by mouth every 4 (four) hours as needed for moderate pain. May take 1-2 first few days following surgery max 8 tabs in 24hrs.  04/04/21   Chadwell, Vonna Kotyk, PA-C  omeprazole (PRILOSEC) 20 MG capsule Take 20 mg by mouth every morning.    [provider]  polyethylene glycol (MIRALAX / GLYCOLAX) 17 g packet Take 17 g by mouth daily as needed.    [provider]  promethazine (PHENERGAN) 12.5 MG tablet Take 12.5 mg by mouth every 6 (six) hours as needed for nausea or vomiting.    [provider]      Allergies    Ciprofloxacin, Codeine, and Sulfa antibiotics    Review of Systems   Review of Systems  Cardiovascular:  Positive for near-syncope.  All other systems reviewed and are negative.  Physical Exam Updated Vital Signs BP (!) 142/66    Pulse 72    Temp 98 F (36.7 C) (Oral)    Resp (!) 21    Ht 5\' 5"  (1.651 m)    Wt 99.8 kg    SpO2 98%    BMI 36.61 kg/m  Physical Exam Vitals and nursing note reviewed.  Constitutional:      General: She is not in acute distress.    Appearance: Normal appearance.     Comments: Tearful  HENT:     Head: Normocephalic and atraumatic.  Eyes:     General:        Right eye: No discharge.        Left eye: No discharge.  Cardiovascular:     Comments: Regular rate  and rhythm.  S1/S2 are distinct without any evidence of murmur, rubs, or gallops.  Radial pulses are 2+ bilaterally.  Dorsalis pedis pulses are 2+ bilaterally.  No evidence of pedal edema. Pulmonary:     Comments: Clear to auscultation bilaterally.  Normal effort.  No respiratory distress.  No evidence of wheezes, rales, or rhonchi heard throughout. Abdominal:     General: Abdomen is flat. Bowel sounds are normal. There is no distension.     Tenderness: There is right CVA tenderness. There is no guarding or rebound.     Comments: Minimal suprapubic tenderness.  Musculoskeletal:        General: Normal range of motion.     Cervical back: Neck supple.  Skin:    General: Skin is warm and dry.     Findings: No rash.  Neurological:     General: No focal deficit present.     Mental Status: She  is alert.  Psychiatric:        Mood and Affect: Mood normal.        Behavior: Behavior normal.    ED Results / Procedures / Treatments   Labs (all labs ordered are listed, but only abnormal results are displayed) Labs Reviewed  URINALYSIS, ROUTINE W REFLEX MICROSCOPIC - Abnormal; Notable for the following components:      Result Value   Leukocytes,Ua LARGE (*)    All other components within normal limits  RESP PANEL BY RT-PCR (FLU A&B, COVID) ARPGX2  URINE CULTURE  BASIC METABOLIC PANEL  CBC  TROPONIN I (HIGH SENSITIVITY)    EKG EKG Interpretation  Date/Time:  Thursday September 04 2021 15:25:32 EST Ventricular Rate:  79 PR Interval:  146 QRS Duration: 88 QT Interval:  390 QTC Calculation: 448 R Axis:   7 Text Interpretation: Sinus rhythm Confirmed by Octaviano Glow [phone]) on 09/04/2021 4:13:16 PM  Radiology CT ABDOMEN PELVIS W CONTRAST  Result Date: 09/04/2021 CLINICAL DATA:  Recurrent near-syncope. Suprapubic abdominal and right flank pain. EXAM: CT ABDOMEN AND PELVIS WITH CONTRAST TECHNIQUE: Multidetector CT imaging of the abdomen and pelvis was performed using the standard protocol following bolus administration of intravenous contrast. RADIATION DOSE REDUCTION: This exam was performed according to the departmental dose-optimization program which includes automated exposure control, adjustment of the mA and/or kV according to patient size and/or use of iterative reconstruction technique. CONTRAST:  166mL OMNIPAQUE IOHEXOL 300 MG/ML  SOLN COMPARISON:  CT abdomen pelvis dated February 07, 2021. FINDINGS: Lower chest: No acute abnormality. Hepatobiliary: Unchanged diffuse hepatic steatosis. No focal liver abnormality. The gallbladder is unremarkable. No biliary dilatation. Pancreas: Unremarkable. No pancreatic ductal dilatation or surrounding inflammatory changes. Spleen: Normal in size without focal abnormality. Adrenals/Urinary Tract: Adrenal glands are unremarkable. Kidneys are  normal, without renal calculi, focal lesion, or hydronephrosis. Unchanged left parapelvic renal cysts. Bladder is unremarkable. Stomach/Bowel: Unchanged small hiatal hernia. Stomach is otherwise within normal limits. Unchanged duodenal diverticula. No bowel wall thickening, distention, or surrounding inflammatory changes. Unchanged severe left-sided colonic diverticulosis. Normal appendix. Vascular/Lymphatic: Aortic atherosclerosis. No enlarged abdominal or pelvic lymph nodes. Reproductive: Status post hysterectomy. No adnexal masses. Other: No abdominal wall hernia or abnormality. No abdominopelvic ascites. No pneumoperitoneum. Musculoskeletal: No acute or significant osseous findings. IMPRESSION: 1. No acute intra-abdominal process. 2. Unchanged hepatic steatosis. 3. Aortic Atherosclerosis (ICD10-I70.0). Electronically Signed   By: Titus Dubin M.D.   On: 09/04/2021 16:40    Procedures Procedures  Cardiac monitoring revealed normal sinus rhythm with normal respirations.  She is 98% on room  air with a normal rate.  Medications Ordered in ED Medications  sodium chloride 0.9 % bolus 1,000 mL (1,000 mLs Intravenous New Bag/Given 09/04/21 1558)  ondansetron (ZOFRAN) injection 4 mg (4 mg Intravenous Given 09/04/21 1603)  iohexol (OMNIPAQUE) 300 MG/ML solution 100 mL (100 mLs Intravenous Contrast Given 09/04/21 1622)    ED Course/ Medical Decision Making/ A&P Clinical Course as of 09/04/21 1736  Thu Sep 04, 2021  1703 On reevaluation, patient states she is feeling better.  I discussed all the results at the bedside with the patient.  Patient was extremely relieved.  I discussed that we will likely  wait on the culture to see if we there is any growth.  I did instruct her to continue taking her nitrofurantoin.  1 L bolus is still currently running. [CF]    Clinical Course User Index [CF] Hendricks Limes, PA-C                           Medical Decision Making Amount and/or Complexity of Data  Reviewed Labs: ordered. Radiology: ordered.  Risk Prescription drug management.   ERMA MONTJOY is a 66 y.o. female who presents to the emergency department today for for evaluation of cystitis symptoms.  Given that this is been going on for a long time I am concerned for poorly treated cystitis that has now progressed to pyelonephritis with right flank pain.  I doubt appendicitis, kidney stone.  I doubt reproductive pathology.  I will plan to get labs, repeat urinalysis with culture, and CT abdomen to evaluate for possible pyelonephritis.  I will also give her a liter of fluid.  I will plan to reassess.  She is not in any significant pain at the moment and declines any pain medication right now.  We will also get orthostatic vital signs.  I personally reviewed the labs and imaging.  CBC without a leukocytosis or anemia.  BMP was completely normal.  Respiratory panel was negative.  Initial troponin was negative.  Patient was not having any chest pain during any of these lightheaded episodes this was ordered in triage.  I do not feel that a second troponin is necessary at this time.  She does not have any significant cardiac risk factors.  Urinalysis was negative.  Urine culture in progress.  CT abdomen pelvis did not show any acute abnormalities.  I do agree with the radiologist of rotation.  As mentioned above, on reevaluation, patient is feeling significantly better.  I did notify her with all the results.  She does note comfortable going home.  I will have her follow-up with her primary care provider.  Strict return precautions given.  She is safe the outpatient setting.  She does not have any significant social determinants of health to impact her care today or discharge.  Final Clinical Impression(s) / ED Diagnoses Final diagnoses:  Right flank pain    Rx / DC Orders ED Discharge Orders     None         Hendricks Limes, [place] 09/04/21 1737    Wyvonnia Dusky,  MD 09/05/21 1112

## 2021-09-04 NOTE — Discharge Instructions (Addendum)
Please monitor MyChart for results of your urine culture.  Please continue nitrofurantoin as prescribed until you get results back from urine culture.  Please continue to drink plenty of fluids.  Sure that dehydration was a factor playing into your symptoms today.  Please follow-up with your primary care provider.  Please return to the emergency part for worsening symptoms.

## 2021-09-04 NOTE — ED Notes (Signed)
Patient transported to CT 

## 2021-09-05 LAB — URINE CULTURE: Culture: <10000 — AB

## 2021-12-02 ENCOUNTER — Encounter (HOSPITAL_BASED_OUTPATIENT_CLINIC_OR_DEPARTMENT_OTHER): Payer: Self-pay

## 2021-12-02 ENCOUNTER — Other Ambulatory Visit: Payer: Self-pay

## 2021-12-02 ENCOUNTER — Emergency Department (HOSPITAL_BASED_OUTPATIENT_CLINIC_OR_DEPARTMENT_OTHER): Payer: Commercial Managed Care - PPO | Admitting: Radiology

## 2021-12-02 ENCOUNTER — Emergency Department (HOSPITAL_BASED_OUTPATIENT_CLINIC_OR_DEPARTMENT_OTHER)
Admission: EM | Admit: 2021-12-02 | Discharge: 2021-12-02 | Disposition: A | Discharge status: Home / Self Care | Payer: Commercial Managed Care - PPO | Attending: Emergency Medicine | Admitting: Emergency Medicine

## 2021-12-02 DIAGNOSIS — R1010 Upper abdominal pain, unspecified: Secondary | ICD-10-CM

## 2021-12-02 DIAGNOSIS — Z79899 Other long term (current) drug therapy: Secondary | ICD-10-CM | POA: Diagnosis not present

## 2021-12-02 DIAGNOSIS — R1013 Epigastric pain: Secondary | ICD-10-CM | POA: Diagnosis present

## 2021-12-02 DIAGNOSIS — R1011 Right upper quadrant pain: Secondary | ICD-10-CM | POA: Diagnosis not present

## 2021-12-02 LAB — CBC
HCT: 40.2 % (ref 36.0–46.0)
Hemoglobin: 13.2 g/dL (ref 12.0–15.0)
MCH: 29.5 pg (ref 26.0–34.0)
MCHC: 32.8 g/dL (ref 30.0–36.0)
MCV: 89.9 fL (ref 80.0–100.0)
Platelets: 196 10*3/uL (ref 150–400)
RBC: 4.47 MIL/uL (ref 3.87–5.11)
RDW: 14.3 % (ref 11.5–15.5)
WBC: 6.7 10*3/uL (ref 4.0–10.5)
nRBC: 0 % (ref 0.0–0.2)

## 2021-12-02 LAB — URINALYSIS, ROUTINE W REFLEX MICROSCOPIC
Bilirubin Urine: NEGATIVE
Glucose, UA: NEGATIVE mg/dL
Hgb urine dipstick: NEGATIVE
Ketones, ur: NEGATIVE mg/dL
Nitrite: NEGATIVE
Specific Gravity, Urine: 1.025 (ref 1.005–1.030)
pH: 5.5 (ref 5.0–8.0)

## 2021-12-02 LAB — COMPREHENSIVE METABOLIC PANEL
ALT: 17 U/L (ref 0–44)
AST: 27 U/L (ref 15–41)
Albumin: 4.3 g/dL (ref 3.5–5.0)
Alkaline Phosphatase: 86 U/L (ref 38–126)
Anion gap: 11 (ref 5–15)
BUN: 12 mg/dL (ref 8–23)
CO2: 25 mmol/L (ref 22–32)
Calcium: 9.5 mg/dL (ref 8.9–10.3)
Chloride: 105 mmol/L (ref 98–111)
Creatinine, Ser: 0.85 mg/dL (ref 0.44–1.00)
GFR, Estimated: >60 mL/min (ref >60)
Glucose, Bld: 103 mg/dL — ABNORMAL HIGH (ref 70–99)
Potassium: 4.3 mmol/L (ref 3.5–5.1)
Sodium: 141 mmol/L (ref 135–145)
Total Bilirubin: 0.5 mg/dL (ref 0.3–1.2)
Total Protein: 7.6 g/dL (ref 6.5–8.1)

## 2021-12-02 LAB — TROPONIN I (HIGH SENSITIVITY): Troponin I (High Sensitivity): 2 ng/L (ref <18)

## 2021-12-02 LAB — D-DIMER, QUANTITATIVE: D-Dimer, Quant: 0.53 ug/mL-FEU — ABNORMAL HIGH (ref 0.00–0.50)

## 2021-12-02 LAB — LIPASE, BLOOD: Lipase: 17 U/L (ref 11–51)

## 2021-12-02 MED ORDER — ONDANSETRON HCL 4 MG/2ML IJ SOLN
4.0000 mg | Freq: Once | INTRAMUSCULAR | Status: AC
  Administered 2021-12-02: 4 mg via INTRAVENOUS
  Filled 2021-12-02: qty 2

## 2021-12-02 NOTE — Discharge Instructions (Signed)
Please read and follow all provided instructions. ? ?Your diagnoses today include:  ?1. Upper abdominal pain   ? ? ?Tests performed today include: ?Blood cell counts and platelets ?Kidney and liver function tests ?Pancreas function test (called lipase) ?Urine test to look for infection ?EKG and blood test for the heart: Did not show any signs of stress on the heart ?Chest x-ray: Did not show any signs of problems with the lungs  ?Screening test for blood clot: Was negative ?Vital signs. See below for your results today.  ? ?Medications prescribed:  ?None ? ?Take any prescribed medications only as directed. ? ?Home care instructions:  ?Follow any educational materials contained in this packet. ? ?Follow-up instructions: ?Please follow-up with your primary care provider in the next 3 days for further evaluation of your symptoms if you are not feeling better. ? ?Return instructions:  ?SEEK IMMEDIATE MEDICAL ATTENTION IF: ?The pain does not go away or becomes severe  ?A temperature above 101F develops  ?Repeated vomiting occurs (multiple episodes)  ?The pain becomes localized to portions of the abdomen. The right side could possibly be appendicitis. In an adult, the left lower portion of the abdomen could be colitis or diverticulitis.  ?Blood is being passed in stools or vomit (bright red or black tarry stools)  ?You develop chest pain, difficulty breathing, dizziness or fainting, or become confused, poorly responsive, or inconsolable (young children) ?If you have any other emergent concerns regarding your health ? ?Additional Information: ?Abdominal (belly) pain can be caused by many things. Your caregiver performed an examination and possibly ordered blood/urine tests and imaging (CT scan, x-rays, ultrasound). Many cases can be observed and treated at home after initial evaluation in the emergency department. Even though you are being discharged home, abdominal pain can be unpredictable. Therefore, you need a repeated  exam if your pain does not resolve, returns, or worsens. Most patients with abdominal pain don't have to be admitted to the hospital or have surgery, but serious problems like appendicitis and gallbladder attacks can start out as nonspecific pain. Many abdominal conditions cannot be diagnosed in one visit, so follow-up evaluations are very important. ? ?Your vital signs today were: ?BP 112/72   Pulse 82   Temp 98.2 ?F (36.8 ?C) (Oral)   Resp 16   Ht 5\' 5"  (1.651 m)   Wt 99.8 kg   SpO2 95%   BMI 36.61 kg/m?  ?If your blood pressure (bp) was elevated above 135/85 this visit, please have this repeated by your doctor within one month. ?-------------- ? ?

## 2021-12-02 NOTE — ED Triage Notes (Signed)
Patient here POV from Home with ABD Pain. ? ?Patient endorses RUQ ABD Pain that began yesterday AM. Constant in Nature and radiates to Back.  ? ?Mild Nausea. No Emesis. No Constipation. No Diarrhea. History of Diverticulosis. No Fevers. ? ?NAD Noted during Triage. A&Ox4. GCS 15. Ambulatory.  ?

## 2021-12-02 NOTE — ED Provider Notes (Signed)
?[facility] EMERGENCY DEPT ?Provider Note ? ? ?CSN: [id number] ?Arrival date & time: 12/02/21  1513 ? ?  ? ?History ? ?Chief Complaint  ?Patient presents with  ? Abdominal Pain  ? ? ?Lori Lester is a 66 y.o. female. ? ?Patient with history of hysterectomy, no other abdominal surgeries presents to the emergency department for evaluation of right upper quadrant, right lower chest and right sided back pain.  Symptoms started yesterday.  Worse with palpation, movement, and taking a deep breath.  No associated shortness of breath.  Patient states that sometimes when she lies down she has a sensation of palpitations.  She has had some nausea but no vomiting.  No diarrhea or urinary symptoms.  Patient has had evaluation of her gallbladder in the past without gallstones and had a normal HIDA scan in 2017.  Patient states that this pain is different than what she has felt in the past.  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 Patient denies risk factors for pulmonary embolism including: unilateral leg swelling, history of DVT/PE/other blood clots, use of exogenous hormones, recent immobilizations, recent surgery (ankle surgery but last year), recent travel (>4hr segment), malignancy, hemoptysis.  ? ? ? ? ?  ? ?Home Medications ?Prior to Admission medications   ?Medication Sig Start Date End Date Taking? Authorizing Provider  ?LORazepam (ATIVAN) 0.5 MG tablet Take 0.5 mg by mouth 2 (two) times daily as needed. 11/19/21  Yes [provider]  ?omeprazole (PRILOSEC) 20 MG capsule Take 20 mg by mouth every morning.   Yes [provider]  ?tolterodine (DETROL LA) 4 MG 24 hr capsule Take 4 mg by mouth daily. 11/11/21  Yes [provider]  ?venlafaxine XR (EFFEXOR-XR) 37.5 MG 24 hr capsule Take 37.5 mg by mouth daily. 11/11/21  Yes [provider]  ?ALPRAZolam Prudy Feeler(XANAX) 0.25 MG tablet Take 0.25 mg  by mouth daily as needed for anxiety.    [provider]  ?buPROPion (WELLBUTRIN XL) 150 MG 24 hr tablet Take 150 mg by mouth daily. ?Patient not taking: Reported on 12/02/2021    [provider]  ?DULoxetine (CYMBALTA) 60 MG capsule Take 60 mg by mouth every morning. ?Patient not taking: Reported on 12/02/2021    [provider]  ?HYDROcodone-acetaminophen (NORCO/VICODIN) 5-325 MG tablet Take 1 tablet by mouth every 4 (four) hours as needed for moderate pain. May take 1-2 first few days following surgery max 8 tabs in 24hrs. ?Patient not taking: Reported on 12/02/2021 04/04/21   Margart Sickleshadwell, Stepan Verrette, PA-C  ?   ? ?Allergies    ?Ciprofloxacin, Codeine, and Sulfa antibiotics   ? ?Review of Systems   ?Review of Systems ? ?Physical Exam ?Updated Vital Signs ?BP (!) 150/92 (BP Location: Right Arm)   Pulse 95   Temp 98.2 ?F (36.8 ?C) (Oral)   Resp 16   Ht 5\' 5"  (1.651 m)   Wt 99.8 kg   SpO2 99%   BMI 36.61 kg/m?  ?Physical Exam ?Vitals and nursing note reviewed.  ?Constitutional:   ?   General: She is not in acute distress. ?   Appearance: She is well-developed.  ?HENT:  ?   Head: Normocephalic and atraumatic.  ?   Right Ear: External ear normal.  ?   Left Ear: External ear normal.  ?   Nose: Nose normal.  ?Eyes:  ?   Conjunctiva/sclera: Conjunctivae normal.  ?Cardiovascular:  ?  Rate and Rhythm: Normal rate and regular rhythm.  ?   Heart sounds: No murmur heard. ?Pulmonary:  ?   Effort: No respiratory distress.  ?   Breath sounds: No wheezing, rhonchi or rales.  ?Abdominal:  ?   Palpations: Abdomen is soft.  ?   Tenderness: There is abdominal tenderness in the right upper quadrant and epigastric area. There is no guarding or rebound.  ?Musculoskeletal:  ?   Cervical back: Normal range of motion and neck supple. No tenderness or bony tenderness.  ?   Thoracic back: Tenderness (Right middle, paraspinous) present. Normal range of motion.  ?   Lumbar back: No tenderness or bony tenderness.  ?    Right lower leg: No edema.  ?   Left lower leg: No edema.  ?Skin: ?   General: Skin is warm and dry.  ?   Findings: No rash.  ?Neurological:  ?   General: No focal deficit present.  ?   Mental Status: She is alert. Mental status is at baseline.  ?   Motor: No weakness.  ?Psychiatric:     ?   Mood and Affect: Mood normal.  ? ? ?ED Results / Procedures / Treatments   ?Labs ?(all labs ordered are listed, but only abnormal results are displayed) ?Labs Reviewed  ?COMPREHENSIVE METABOLIC PANEL - Abnormal; Notable for the following components:  ?    Result Value  ? Glucose, Bld 103 (*)   ? All other components within normal limits  ?URINALYSIS, ROUTINE W REFLEX MICROSCOPIC - Abnormal; Notable for the following components:  ? Protein, ur TRACE (*)   ? Leukocytes,Ua LARGE (*)   ? All other components within normal limits  ?D-DIMER, QUANTITATIVE - Abnormal; Notable for the following components:  ? D-Dimer, Quant 0.53 (*)   ? All other components within normal limits  ?LIPASE, BLOOD  ?CBC  ?TROPONIN I (HIGH SENSITIVITY)  ? ? ?EKG ?EKG Interpretation ? ?Date/Time:  Tuesday December 02 2021 16:08:18 EDT ?Ventricular Rate:  86 ?PR Interval:  146 ?QRS Duration: 87 ?QT Interval:  384 ?QTC Calculation: 460 ?R Axis:   40 ?Text Interpretation: Sinus rhythm Low voltage, precordial leads Nonspecific T abnormalities, inferior leads New since previous tracing Confirmed by Vanetta Mulders [phone]) on 12/02/2021 4:27:58 PM ? ?Radiology ?DG Chest 2 View ? ?Result Date: 12/02/2021 ?CLINICAL DATA:  Right upper abdominal/lower chest pain, back pain EXAM: CHEST - 2 VIEW COMPARISON:  Chest radiograph 12/01/2015 FINDINGS: The cardiomediastinal silhouette is normal. There is no focal consolidation or pulmonary edema. There is no pleural effusion or pneumothorax There is no acute osseous abnormality. IMPRESSION: No radiographic evidence of acute cardiopulmonary process. Electronically Signed   By: Lesia Hausen M.D.   On: 12/02/2021 16:29    ? ?Procedures ?Procedures  ? ? ?Medications Ordered in ED ?Medications  ?ondansetron (ZOFRAN) injection 4 mg (4 mg Intravenous Given 12/02/21 1658)  ? ? ?ED Course/ Medical Decision Making/ A&P ?  ? ?Patient seen and examined. History obtained directly from patient.  ? ?Labs/EKG: Ordered EKG.  Also CBC, CMP, lipase, D-dimer. ? ?Imaging: Ordered chest x-ray. ? ?Medications/Fluids: Ordered: Zofran.  Considered administration of pain medication however patient declines. ? ?Most recent vital signs reviewed and are as follows: ?BP (!) 150/92 (BP Location: Right Arm)   Pulse 95   Temp 98.2 ?F (36.8 ?C) (Oral)   Resp 16   Ht 5\' 5"  (1.651 m)   Wt 99.8 kg   SpO2 99%   BMI 36.61 kg/m?  ? ?  Initial impression: Right upper abdominal pain.  Will consider cardiac and pulmonary etiologies due to worsening pain with deep breathing.  Also could be musculoskeletal in nature. ? ?5:39 PM Reassessment performed. Patient appears comfortable.  Seems reassured after discussion of results. ? ?Labs personally reviewed and interpreted including: CBC unremarkable; CMP unremarkable with normal liver function tests; lipase normal; UA not overall compelling for UTI; D-dimer 0.53 less than age-adjusted cutoff of 0.64; troponin. ? ?Imaging personally visualized and interpreted including: Chest x-ray, agree negative. ? ?Reviewed pertinent lab work and imaging with patient at bedside. Questions answered.  ? ?Most current vital signs reviewed and are as follows: ?BP 112/72   Pulse 82   Temp 98.2 ?F (36.8 ?C) (Oral)   Resp 16   Ht 5\' 5"  (1.651 m)   Wt 99.8 kg   SpO2 95%   BMI 36.61 kg/m?  ? ?Plan: Discharge to home.  ? ?Prescriptions written for: None ? ?Other home care instructions discussed: Heat on sore area, rest ? ?ED return instructions discussed: The patient was urged to return to the Emergency Department immediately with worsening of current symptoms, worsening abdominal pain, persistent vomiting, blood noted in stools, fever, or  any other concerns. The patient verbalized understanding.  ? ?Follow-up instructions discussed: Patient encouraged to follow-up with their PCP in 3 days if not improving. ? ? ?                        ?Medical Decision Making ?Amount and/or

## 2021-12-02 NOTE — ED Notes (Signed)
Discharge paperwork given and understood. 

## 2021-12-02 NOTE — ED Notes (Signed)
20 g IV placed in R AC; flushed and saline locked.  ?

## 2022-03-23 ENCOUNTER — Encounter (HOSPITAL_BASED_OUTPATIENT_CLINIC_OR_DEPARTMENT_OTHER): Payer: Self-pay

## 2022-03-23 ENCOUNTER — Emergency Department (HOSPITAL_BASED_OUTPATIENT_CLINIC_OR_DEPARTMENT_OTHER): Payer: Commercial Managed Care - PPO

## 2022-03-23 ENCOUNTER — Emergency Department (HOSPITAL_BASED_OUTPATIENT_CLINIC_OR_DEPARTMENT_OTHER)
Admission: EM | Admit: 2022-03-23 | Discharge: 2022-03-23 | Disposition: A | Discharge status: Home / Self Care | Payer: Commercial Managed Care - PPO | Attending: Emergency Medicine | Admitting: Emergency Medicine

## 2022-03-23 ENCOUNTER — Other Ambulatory Visit: Payer: Self-pay

## 2022-03-23 DIAGNOSIS — R300 Dysuria: Secondary | ICD-10-CM | POA: Diagnosis not present

## 2022-03-23 DIAGNOSIS — Z87891 Personal history of nicotine dependence: Secondary | ICD-10-CM | POA: Insufficient documentation

## 2022-03-23 DIAGNOSIS — R42 Dizziness and giddiness: Secondary | ICD-10-CM | POA: Diagnosis not present

## 2022-03-23 DIAGNOSIS — R102 Pelvic and perineal pain: Secondary | ICD-10-CM | POA: Diagnosis present

## 2022-03-23 LAB — WET PREP, GENITAL
Clue Cells Wet Prep HPF POC: NONE SEEN
Sperm: NONE SEEN
Trich, Wet Prep: NONE SEEN
WBC, Wet Prep HPF POC: >=10 — AB (ref <10)
Yeast Wet Prep HPF POC: NONE SEEN

## 2022-03-23 LAB — URINALYSIS, ROUTINE W REFLEX MICROSCOPIC
Bilirubin Urine: NEGATIVE
Glucose, UA: NEGATIVE mg/dL
Hgb urine dipstick: NEGATIVE
Ketones, ur: NEGATIVE mg/dL
Nitrite: NEGATIVE
Protein, ur: NEGATIVE mg/dL
Specific Gravity, Urine: 1.024 (ref 1.005–1.030)
pH: 5 (ref 5.0–8.0)

## 2022-03-23 LAB — COMPREHENSIVE METABOLIC PANEL
ALT: 14 U/L (ref 0–44)
AST: 20 U/L (ref 15–41)
Albumin: 4.4 g/dL (ref 3.5–5.0)
Alkaline Phosphatase: 90 U/L (ref 38–126)
Anion gap: 12 (ref 5–15)
BUN: 9 mg/dL (ref 8–23)
CO2: 25 mmol/L (ref 22–32)
Calcium: 9.6 mg/dL (ref 8.9–10.3)
Chloride: 104 mmol/L (ref 98–111)
Creatinine, Ser: 0.81 mg/dL (ref 0.44–1.00)
GFR, Estimated: >60 mL/min (ref >60)
Glucose, Bld: 102 mg/dL — ABNORMAL HIGH (ref 70–99)
Potassium: 3.6 mmol/L (ref 3.5–5.1)
Sodium: 141 mmol/L (ref 135–145)
Total Bilirubin: 0.4 mg/dL (ref 0.3–1.2)
Total Protein: 7.7 g/dL (ref 6.5–8.1)

## 2022-03-23 LAB — CBC
HCT: 40.9 % (ref 36.0–46.0)
Hemoglobin: 13.6 g/dL (ref 12.0–15.0)
MCH: 30.2 pg (ref 26.0–34.0)
MCHC: 33.3 g/dL (ref 30.0–36.0)
MCV: 90.9 fL (ref 80.0–100.0)
Platelets: 216 10*3/uL (ref 150–400)
RBC: 4.5 MIL/uL (ref 3.87–5.11)
RDW: 13.7 % (ref 11.5–15.5)
WBC: 7.8 10*3/uL (ref 4.0–10.5)
nRBC: 0 % (ref 0.0–0.2)

## 2022-03-23 LAB — LIPASE, BLOOD: Lipase: 15 U/L (ref 11–51)

## 2022-03-23 MED ORDER — LACTATED RINGERS IV BOLUS
1000.0000 mL | Freq: Once | INTRAVENOUS | Status: DC
Start: 2022-03-23 — End: 2022-03-23

## 2022-03-23 MED ORDER — ONDANSETRON HCL 4 MG PO TABS: 4.0000 mg | ORAL_TABLET | Freq: Four times a day (QID) | ORAL | 0 refills | Status: DC

## 2022-03-23 MED ORDER — CEPHALEXIN 500 MG PO CAPS: 500.0000 mg | ORAL_CAPSULE | Freq: Two times a day (BID) | ORAL | 0 refills | Status: AC

## 2022-03-23 MED ORDER — IOHEXOL 300 MG/ML  SOLN
100.0000 mL | Freq: Once | INTRAMUSCULAR | Status: AC | PRN
  Administered 2022-03-23: 100 mL via INTRAVENOUS

## 2022-03-23 NOTE — Discharge Instructions (Addendum)
Your labs today were overall very reassuring.  The only abnormality we could see was the white blood cells both in your urine and on your wet prep.  Typically this indicates some type of inflammation or infection such as a urinary tract infection or vaginal infection.  I did send out a gonorrhea and chlamydia panel, though I suspect that this will be negative.  Your CT was also normal. I am going to treat you with an antibiotic for a suspected underlying urinary tract infection.  If you are still having symptoms after you complete this course, please follow-up with either your PCP or your OB/GYN.  Continue to drink plenty of fluids I hope you feel better soon.

## 2022-03-23 NOTE — ED Triage Notes (Signed)
Patient here POV from Home.  Endorses not feeling well over the past Two Weeks including Urinary Discomfort, Lower ABD Pain, and Right Flank Pain.   Patient believes she may have a UTI. Mild Nausea. No Emesis. No Known Fevers.  NAD Noted during Triage. A&Ox4. GCS 15. Ambulatory.

## 2022-03-23 NOTE — ED Provider Notes (Signed)
[place] EMERGENCY DEPT Provider Note   CSN: [id number] Arrival date & time: 03/23/22  1410     History  Chief Complaint  Patient presents with   Pelvic Pain    Lori Lester is a 66 y.o. female with history of GERD, hyperlipidemia who presents to the emergency department for evaluation of just not feeling well over the last 2 weeks.  Over the weekend, she has not developed mild pelvic pain, urinary discomfort and right flank pain.  She states "I just do not feel right down there".  She states she is concerned that she has a UTI.  Patient also states that she is working with her PCP right now for referral for colonoscopy as her she has had abnormal stools.  They are small and thin shaped, however she denies blood in the stools.  She states today that sometimes she is felt slightly lightheaded and wonders that she may be dehydrated.  She denies vaginal pain, vaginal discharge, vaginal bleeding.  She had previous hysterectomy.  She denies fevers, chills, nausea, vomiting, hematuria, chest pain, shortness of breath, numbness, tingling, slurred speech and facial droop   Pelvic Pain Pertinent negatives include no chest pain, no abdominal pain, no headaches and no shortness of breath.       Home Medications Prior to Admission medications   Medication Sig Start Date End Date Taking? Authorizing Provider  cephALEXin (KEFLEX) 500 MG capsule Take 1 capsule (500 mg total) by mouth 2 (two) times daily for 5 days. 03/23/22 03/28/22 Yes Kathe Becton R, PA-C  ondansetron (ZOFRAN) 4 MG tablet Take 1 tablet (4 mg total) by mouth every 6 (six) hours. 03/23/22  Yes Tonye Pearson, PA-C  ALPRAZolam (XANAX) 0.25 MG tablet Take 0.25 mg by mouth daily as needed for anxiety.    [provider]  buPROPion (WELLBUTRIN XL) 150 MG 24 hr tablet Take 150 mg by mouth daily. Patient not taking: Reported on 12/02/2021    [provider]  DULoxetine (CYMBALTA) 60 MG capsule Take  60 mg by mouth every morning. Patient not taking: Reported on 12/02/2021    [provider]  HYDROcodone-acetaminophen (NORCO/VICODIN) 5-325 MG tablet Take 1 tablet by mouth every 4 (four) hours as needed for moderate pain. May take 1-2 first few days following surgery max 8 tabs in 24hrs. Patient not taking: Reported on 12/02/2021 04/04/21   Chadwell, Vonna Kotyk, PA-C  LORazepam (ATIVAN) 0.5 MG tablet Take 0.5 mg by mouth 2 (two) times daily as needed. 11/19/21   [provider]  omeprazole (PRILOSEC) 20 MG capsule Take 20 mg by mouth every morning.    [provider]  tolterodine (DETROL LA) 4 MG 24 hr capsule Take 4 mg by mouth daily. 11/11/21   [provider]  venlafaxine XR (EFFEXOR-XR) 37.5 MG 24 hr capsule Take 37.5 mg by mouth daily. 11/11/21   [provider]      Allergies    Ciprofloxacin, Codeine, and Sulfa antibiotics    Review of Systems   Review of Systems  Constitutional:  Negative for fever.  Respiratory:  Negative for shortness of breath.   Cardiovascular:  Negative for chest pain.  Gastrointestinal:  Negative for abdominal pain.  Genitourinary:  Positive for dysuria and pelvic pain. Negative for hematuria, vaginal bleeding, vaginal discharge and vaginal pain.  Neurological:  Negative for syncope, numbness and headaches.    Physical Exam Updated Vital Signs BP (!) 153/73   Pulse 80   Temp 98.3 F (36.8 C) (  Oral)   Resp 16   Ht 5\' 5"  (1.651 m)   Wt 99.8 kg   SpO2 98%   BMI 36.61 kg/m  Physical Exam Vitals and nursing note reviewed.  Constitutional:      General: She is not in acute distress.    Appearance: She is not ill-appearing.  HENT:     Head: Atraumatic.  Eyes:     Conjunctiva/sclera: Conjunctivae normal.  Cardiovascular:     Rate and Rhythm: Normal rate and regular rhythm.     Pulses: Normal pulses.     Heart sounds: No murmur heard. Pulmonary:     Effort: Pulmonary effort is normal. No respiratory distress.      Breath sounds: Normal breath sounds.  Abdominal:     General: Abdomen is flat. There is no distension.     Palpations: Abdomen is soft.     Tenderness: There is no abdominal tenderness.  Genitourinary:    Vagina: Normal. No vaginal discharge or bleeding.  Musculoskeletal:        General: Normal range of motion.     Cervical back: Normal range of motion.  Skin:    General: Skin is warm and dry.     Capillary Refill: Capillary refill takes less than 2 seconds.  Neurological:     General: No focal deficit present.     Mental Status: She is alert.  Psychiatric:        Mood and Affect: Mood normal.     ED Results / Procedures / Treatments   Labs (all labs ordered are listed, but only abnormal results are displayed) Labs Reviewed  WET PREP, GENITAL - Abnormal; Notable for the following components:      Result Value   WBC, Wet Prep HPF POC >=10 (*)    All other components within normal limits  COMPREHENSIVE METABOLIC PANEL - Abnormal; Notable for the following components:   Glucose, Bld 102 (*)    All other components within normal limits  URINALYSIS, ROUTINE W REFLEX MICROSCOPIC - Abnormal; Notable for the following components:   Leukocytes,Ua MODERATE (*)    All other components within normal limits  LIPASE, BLOOD  CBC  GC/CHLAMYDIA PROBE AMP ([place]) NOT AT [facility]    EKG None  Radiology CT ABDOMEN PELVIS W CONTRAST  Result Date: 03/23/2022 CLINICAL DATA:  Abdominal pain, acute, nonlocalized. Endorses not feeling well over the past Two Weeks including Urinary Discomfort, Lower ABD Pain, and Right Flank Pain. EXAM: CT ABDOMEN AND PELVIS WITH CONTRAST TECHNIQUE: Multidetector CT imaging of the abdomen and pelvis was performed using the standard protocol following bolus administration of intravenous contrast. RADIATION DOSE REDUCTION: This exam was performed according to the departmental dose-optimization program which includes automated exposure control, adjustment of  the mA and/or kV according to patient size and/or use of iterative reconstruction technique. CONTRAST:  17mL OMNIPAQUE IOHEXOL 300 MG/ML  SOLN COMPARISON:  CT abdomen pelvis 09/04/2021 FINDINGS: Lower chest: No acute abnormality. Small volume hiatal hernia. Mitral annular calcifications. Hepatobiliary: No focal liver abnormality. No gallstones, gallbladder wall thickening, or pericholecystic fluid. No biliary dilatation. Pancreas: No focal lesion. Normal pancreatic contour. No surrounding inflammatory changes. No main pancreatic ductal dilatation. Spleen: Normal in size without focal abnormality. Adrenals/Urinary Tract: No adrenal nodule bilaterally. Bilateral kidneys enhance symmetrically. No hydronephrosis. No hydroureter. The urinary bladder is unremarkable. Stomach/Bowel: Stomach is within normal limits. No evidence of bowel wall thickening or dilatation. Scattered colonic diverticulosis with diffuse sigmoid diverticulosis. Appendix appears normal. Vascular/Lymphatic: No abdominal aorta  or iliac aneurysm. Mild atherosclerotic plaque of the aorta and its branches. No abdominal, pelvic, or inguinal lymphadenopathy. Reproductive: Status post hysterectomy. No adnexal masses. Other: No intraperitoneal free fluid. No intraperitoneal free gas. No organized fluid collection. Musculoskeletal: No abdominal wall hernia or abnormality. No suspicious lytic or blastic osseous lesions. No acute displaced fracture. Grade 1 anterolisthesis of L4 on L5. L5-S1 intervertebral disc space vacuum phenomenon and degenerative changes. IMPRESSION: 1. Small hiatal hernia. 2. Colonic diverticulosis with no acute diverticulitis. 3.  Aortic Atherosclerosis (ICD10-I70.0). Electronically Signed   By: Tish Frederickson M.D.   On: 03/23/2022 18:54    Procedures Procedures    Medications Ordered in ED Medications  iohexol (OMNIPAQUE) 300 MG/ML solution 100 mL (100 mLs Intravenous Contrast Given 03/23/22 1833)    ED Course/ Medical  Decision Making/ A&P                           Medical Decision Making Amount and/or Complexity of Data Reviewed Labs: ordered. Radiology: ordered.  Risk Prescription drug management.   Social determinants of health:  Social History   Socioeconomic History   Marital status: Married    Spouse name: Not on file   Number of children: Not on file   Years of education: Not on file   Highest education level: Not on file  Occupational History   Not on file  Tobacco Use   Smoking status: Former    Packs/day: 1.00    Types: Cigarettes    Quit date: 08/16/2021    Years since quitting: 0.6   Smokeless tobacco: Never  Vaping Use   Vaping Use: Never used  Substance and Sexual Activity   Alcohol use: Yes   Drug use: No   Sexual activity: Not on file  Other Topics Concern   Not on file  Social History Narrative   Not on file   Social Determinants of Health   Financial Resource Strain: Not on file  Food Insecurity: Not on file  Transportation Needs: Not on file  Physical Activity: Not on file  Stress: Not on file  Social Connections: Not on file  Intimate Partner Violence: Not on file     Initial impression:  This patient presents to the ED for concern of not feeling well and some urinary discomfort, this involves an extensive number of treatment options, and is a complaint that carries with it a high risk of complications and morbidity.   Differentials include UTI, sepsis, kidney stone.   Comorbidities affecting care:  GERD, hyperlipidemia  Additional history obtained: None  Lab Tests  I Ordered, reviewed, and interpreted labs and EKG.  The pertinent results include:  CMP, CBC normal UA without evidence of infection although there was moderate amounts of white blood cells Wet prep with white blood cells but no bacteria or yeast.  Negative GC chlamydia  Imaging Studies ordered:  I ordered imaging studies including  CT abdomen and pelvis without acute  findings I independently visualized and interpreted imaging and I agree with the radiologist interpretation.    ED Course/Re-evaluation: Presents well-appearing and in no acute distress.  Vitals are without significant abnormality.  Abdomen is soft, nondistended without obvious tenderness to palpation.  Negative CVA tenderness bilaterally.  CMP and CBC were reassuring.  UA did not show evidence of infection, although there were moderate amounts of leukocytes.  This raise suspicion for possible underlying vaginal infection.  I did wet prep which also showed large amounts  of white blood cells, however no evidence of yeast, trichomoniasis or BV.  GC chlamydia sent out out of abundance of caution which was also negative.  To rule out possible underlying kidney stone, CT abdomen pelvis was obtained which was ultimately negative and without acute findings.  Ultimately, given her symptoms and the white blood cells, will empirically treat with Keflex twice daily x5 days.  Recommend follow-up with PCP if she still remains symptomatic after completion of antibiotics.  Return precautions discussed.  Patient expresses understanding and is amenable to plan.  Disposition:  After consideration of the diagnostic results, physical exam, history and the patients response to treatment feel that the patent would benefit from discharge.   Dysuria: Plan and management as described above. Discharged home in good condition. Final Clinical Impression(s) / ED Diagnoses Final diagnoses:  Dysuria    Rx / DC Orders ED Discharge Orders          Ordered    cephALEXin (KEFLEX) 500 MG capsule  2 times daily        03/23/22 1742    ondansetron (ZOFRAN) 4 MG tablet  Every 6 hours        03/23/22 1742              Delight Ovens 03/24/22 Zena Amos, MD 03/25/22 [phone]

## 2022-03-24 LAB — GC/CHLAMYDIA PROBE AMP (~~LOC~~) NOT AT ARMC
Chlamydia: NEGATIVE
Comment: NEGATIVE
Comment: NORMAL
Neisseria Gonorrhea: NEGATIVE

## 2022-06-18 DIAGNOSIS — K449 Diaphragmatic hernia without obstruction or gangrene: Secondary | ICD-10-CM | POA: Diagnosis not present

## 2022-06-18 DIAGNOSIS — R0789 Other chest pain: Secondary | ICD-10-CM | POA: Diagnosis not present

## 2022-07-16 DIAGNOSIS — J069 Acute upper respiratory infection, unspecified: Secondary | ICD-10-CM | POA: Diagnosis not present

## 2022-07-24 DIAGNOSIS — F411 Generalized anxiety disorder: Secondary | ICD-10-CM | POA: Diagnosis not present

## 2022-07-24 DIAGNOSIS — Z8639 Personal history of other endocrine, nutritional and metabolic disease: Secondary | ICD-10-CM | POA: Diagnosis not present

## 2022-07-24 DIAGNOSIS — N3281 Overactive bladder: Secondary | ICD-10-CM | POA: Diagnosis not present

## 2022-07-24 DIAGNOSIS — R0789 Other chest pain: Secondary | ICD-10-CM | POA: Diagnosis not present

## 2022-07-24 DIAGNOSIS — M150 Primary generalized (osteo)arthritis: Secondary | ICD-10-CM | POA: Diagnosis not present

## 2022-07-24 DIAGNOSIS — K219 Gastro-esophageal reflux disease without esophagitis: Secondary | ICD-10-CM | POA: Diagnosis not present

## 2022-07-24 DIAGNOSIS — R7309 Other abnormal glucose: Secondary | ICD-10-CM | POA: Diagnosis not present

## 2022-07-24 DIAGNOSIS — E785 Hyperlipidemia, unspecified: Secondary | ICD-10-CM | POA: Diagnosis not present

## 2022-07-29 ENCOUNTER — Other Ambulatory Visit: Payer: Self-pay

## 2022-07-29 ENCOUNTER — Emergency Department (HOSPITAL_BASED_OUTPATIENT_CLINIC_OR_DEPARTMENT_OTHER): Payer: Commercial Managed Care - PPO

## 2022-07-29 ENCOUNTER — Emergency Department (HOSPITAL_BASED_OUTPATIENT_CLINIC_OR_DEPARTMENT_OTHER): Payer: Commercial Managed Care - PPO | Admitting: Radiology

## 2022-07-29 ENCOUNTER — Emergency Department (HOSPITAL_BASED_OUTPATIENT_CLINIC_OR_DEPARTMENT_OTHER)
Admission: EM | Admit: 2022-07-29 | Discharge: 2022-07-29 | Disposition: A | Discharge status: Home / Self Care | Payer: Commercial Managed Care - PPO | Attending: Emergency Medicine | Admitting: Emergency Medicine

## 2022-07-29 ENCOUNTER — Encounter (HOSPITAL_BASED_OUTPATIENT_CLINIC_OR_DEPARTMENT_OTHER): Payer: Self-pay | Admitting: Emergency Medicine

## 2022-07-29 DIAGNOSIS — K5792 Diverticulitis of intestine, part unspecified, without perforation or abscess without bleeding: Secondary | ICD-10-CM | POA: Diagnosis not present

## 2022-07-29 DIAGNOSIS — R1032 Left lower quadrant pain: Secondary | ICD-10-CM | POA: Diagnosis present

## 2022-07-29 DIAGNOSIS — Z1152 Encounter for screening for COVID-19: Secondary | ICD-10-CM | POA: Diagnosis not present

## 2022-07-29 LAB — BASIC METABOLIC PANEL
Anion gap: 12 (ref 5–15)
BUN: 11 mg/dL (ref 8–23)
CO2: 26 mmol/L (ref 22–32)
Calcium: 9.4 mg/dL (ref 8.9–10.3)
Chloride: 103 mmol/L (ref 98–111)
Creatinine, Ser: 0.73 mg/dL (ref 0.44–1.00)
GFR, Estimated: >60 mL/min (ref >60)
Glucose, Bld: 109 mg/dL — ABNORMAL HIGH (ref 70–99)
Potassium: 4.2 mmol/L (ref 3.5–5.1)
Sodium: 141 mmol/L (ref 135–145)

## 2022-07-29 LAB — URINALYSIS, ROUTINE W REFLEX MICROSCOPIC
Bilirubin Urine: NEGATIVE
Glucose, UA: NEGATIVE mg/dL
Hgb urine dipstick: NEGATIVE
Ketones, ur: NEGATIVE mg/dL
Nitrite: NEGATIVE
Protein, ur: NEGATIVE mg/dL
Specific Gravity, Urine: 1.026 (ref 1.005–1.030)
Trans Epithel, UA: 1
pH: 5.5 (ref 5.0–8.0)

## 2022-07-29 LAB — LIPASE, BLOOD: Lipase: 14 U/L (ref 11–51)

## 2022-07-29 LAB — RESP PANEL BY RT-PCR (RSV, FLU A&B, COVID)  RVPGX2
Influenza A by PCR: NEGATIVE
Influenza B by PCR: NEGATIVE
Resp Syncytial Virus by PCR: NEGATIVE
SARS Coronavirus 2 by RT PCR: NEGATIVE

## 2022-07-29 LAB — CBC
HCT: 41.9 % (ref 36.0–46.0)
Hemoglobin: 14.4 g/dL (ref 12.0–15.0)
MCH: 31.2 pg (ref 26.0–34.0)
MCHC: 34.4 g/dL (ref 30.0–36.0)
MCV: 90.7 fL (ref 80.0–100.0)
Platelets: 236 10*3/uL (ref 150–400)
RBC: 4.62 MIL/uL (ref 3.87–5.11)
RDW: 14.2 % (ref 11.5–15.5)
WBC: 8 10*3/uL (ref 4.0–10.5)
nRBC: 0 % (ref 0.0–0.2)

## 2022-07-29 LAB — TROPONIN I (HIGH SENSITIVITY): Troponin I (High Sensitivity): 3 ng/L (ref <18)

## 2022-07-29 MED ORDER — METRONIDAZOLE 500 MG PO TABS: 500.0000 mg | ORAL_TABLET | Freq: Two times a day (BID) | ORAL | 0 refills | Status: DC

## 2022-07-29 MED ORDER — METOCLOPRAMIDE HCL 5 MG/ML IJ SOLN
10.0000 mg | Freq: Once | INTRAMUSCULAR | Status: AC
  Administered 2022-07-29: 10 mg via INTRAVENOUS
  Filled 2022-07-29: qty 2

## 2022-07-29 MED ORDER — LACTATED RINGERS IV BOLUS
1000.0000 mL | Freq: Once | INTRAVENOUS | Status: AC
  Administered 2022-07-29: 1000 mL via INTRAVENOUS

## 2022-07-29 MED ORDER — IOHEXOL 300 MG/ML  SOLN
100.0000 mL | Freq: Once | INTRAMUSCULAR | Status: AC | PRN
  Administered 2022-07-29: 100 mL via INTRAVENOUS

## 2022-07-29 MED ORDER — CIPROFLOXACIN HCL 500 MG PO TABS: 500.0000 mg | ORAL_TABLET | Freq: Two times a day (BID) | ORAL | 0 refills | Status: DC

## 2022-07-29 MED ORDER — METOCLOPRAMIDE HCL 10 MG PO TABS: 10.0000 mg | ORAL_TABLET | Freq: Four times a day (QID) | ORAL | 0 refills | Status: DC

## 2022-07-29 MED ORDER — KETOROLAC TROMETHAMINE 15 MG/ML IJ SOLN
15.0000 mg | Freq: Once | INTRAMUSCULAR | Status: AC
  Administered 2022-07-29: 15 mg via INTRAVENOUS
  Filled 2022-07-29: qty 1

## 2022-07-29 NOTE — ED Triage Notes (Addendum)
Lower back pain for that last week. General not feeling well Temp 101 at home episode of chest pain earlier today

## 2022-07-29 NOTE — ED Notes (Signed)
ED Provider at bedside. 

## 2022-07-29 NOTE — ED Provider Notes (Signed)
[facility] EMERGENCY DEPT Provider Note   CSN: [id number] Arrival date & time: 07/29/22  1903     History Chief Complaint  Patient presents with   Back Pain    HPI Lori Lester is a 66 y.o. female presenting for left lower quadrant pain, generalized malaise, subjective fevers and chills.  She is otherwise ambulatory poor p.o. tolerance.  She endorses a temperature of 101 at home, back pain and nausea.  She is curious if she is having redevelopment of her diverticulitis.  History of similar in the past.  No known sick contacts.  Otherwise ambulatory tolerating some amounts of p.o. intake.  Denies any diarrhea but is having pain with bowel movements..   Patient's recorded medical, surgical, social, medication list and allergies were reviewed in the Snapshot window as part of the initial history.   Review of Systems   Review of Systems  Constitutional:  Positive for fatigue and fever. Negative for chills.  HENT:  Negative for ear pain and sore throat.   Eyes:  Negative for pain and visual disturbance.  Respiratory:  Negative for cough and shortness of breath.   Cardiovascular:  Negative for chest pain and palpitations.  Gastrointestinal:  Positive for abdominal pain and nausea. Negative for vomiting.  Genitourinary:  Negative for dysuria and hematuria.  Musculoskeletal:  Negative for arthralgias and back pain.  Skin:  Negative for color change and rash.  Neurological:  Negative for seizures and syncope.  All other systems reviewed and are negative.   Physical Exam Updated Vital Signs BP 136/69   Pulse 80   Temp 97.7 F (36.5 C) (Oral)   Resp 16   Ht 5\' 6"  (1.676 m)   Wt 94.3 kg   SpO2 97%   BMI 33.57 kg/m  Physical Exam Vitals and nursing note reviewed.  Constitutional:      General: She is not in acute distress.    Appearance: She is well-developed.  HENT:     Head: Normocephalic and atraumatic.  Eyes:     Conjunctiva/sclera: Conjunctivae  normal.  Cardiovascular:     Rate and Rhythm: Normal rate and regular rhythm.     Heart sounds: No murmur heard. Pulmonary:     Effort: Pulmonary effort is normal. No respiratory distress.     Breath sounds: Normal breath sounds.  Abdominal:     Palpations: Abdomen is soft.     Tenderness: There is abdominal tenderness. There is no guarding.  Musculoskeletal:        General: No swelling.     Cervical back: Neck supple.  Skin:    General: Skin is warm and dry.     Capillary Refill: Capillary refill takes less than 2 seconds.  Neurological:     Mental Status: She is alert.  Psychiatric:        Mood and Affect: Mood normal.      ED Course/ Medical Decision Making/ A&P Clinical Course as of 07/29/22 2326  Wed Jul 29, 2022  2133 Temp of 101 Back pain/nausea [CC]  2251 diverticulitis [CC]    Clinical Course User Index [CC] 2252, MD    Procedures Procedures   Medications Ordered in ED Medications  lactated ringers bolus 1,000 mL (0 mLs Intravenous Stopped 07/29/22 2319)  ketorolac (TORADOL) 15 MG/ML injection 15 mg (15 mg Intravenous Given 07/29/22 2200)  metoCLOPramide (REGLAN) injection 10 mg (10 mg Intravenous Given 07/29/22 2200)  iohexol (OMNIPAQUE) 300 MG/ML solution 100 mL (100 mLs Intravenous Contrast Given 07/29/22  2231)   Medical Decision Making:   Lori Lester is a 66 y.o. female who presented to the ED today with abdominal pain, detailed above.    Patient's presentation is complicated by their history of advanced age, multiple comorbid medical problems.  Patient placed on continuous vitals and telemetry monitoring while in ED which was reviewed periodically.  Complete initial physical exam performed, notably the patient  was hemodynamically stable in no acute distress.     Reviewed and confirmed nursing documentation for past medical history, family history, social history.    Initial Assessment:   With the patient's presentation of  abdominal pain, most likely diagnosis is nonspecific etiology. Other diagnoses were considered including (but not limited to) gastroenteritis, colitis, small bowel obstruction, appendicitis, cholecystitis, pancreatitis, nephrolithiasis, UTI, pyleonephritis. These are considered less likely due to history of present illness and physical exam findings.   This is most consistent with an acute life/limb threatening illness complicated by underlying chronic conditions.   Initial Plan:  CBC/CMP to evaluate for underlying infectious/metabolic etiology for patient's abdominal pain  Lipase to evaluate for pancreatitis  EKG to evaluate for cardiac source of pain  CTAB/Pelvis with contrast to evaluate for structural/surgical etiology of patients' severe abdominal pain.  Urinalysis and repeat physical assessment to evaluate for UTI/Pyelonpehritis  Empiric management of symptoms with escalating pain control and antiemetics as needed.   Initial Study Results:   Laboratory  All laboratory results reviewed without evidence of clinically relevant pathology.    EKG EKG was reviewed independently. Rate, rhythm, axis, intervals all examined and without medically relevant abnormality. ST segments without concerns for elevations.    Radiology All images reviewed independently. Agree with radiology report at this time.   CT ABDOMEN PELVIS W CONTRAST  Result Date: 07/29/2022 CLINICAL DATA:  Acute abdominal pain. EXAM: CT ABDOMEN AND PELVIS WITH CONTRAST TECHNIQUE: Multidetector CT imaging of the abdomen and pelvis was performed using the standard protocol following bolus administration of intravenous contrast. RADIATION DOSE REDUCTION: This exam was performed according to the departmental dose-optimization program which includes automated exposure control, adjustment of the mA and/or kV according to patient size and/or use of iterative reconstruction technique. CONTRAST:  OMNIPAQUE IOHEXOL 300 MG/ML  SOLN  COMPARISON:  CT 03/23/2022 FINDINGS: Lower chest: No basilar airspace disease or pleural effusion. Hepatobiliary: Diffuse hepatic steatosis. No focal liver abnormality. No capsular nodularity. Gallbladder physiologically distended, no calcified stone. No biliary dilatation. Pancreas: Fatty atrophy of the pancreas. No ductal dilatation or inflammation. Spleen: Normal in size without focal abnormality. Adrenals/Urinary Tract: Normal adrenal glands. No hydronephrosis or perinephric edema. Homogeneous renal enhancement with symmetric excretion on delayed phase imaging. Parapelvic cysts in the left kidney. No further follow-up is needed. Urinary bladder is partially distended without wall thickening. Stomach/Bowel: There is mild fat stranding about the distal descending and sigmoid colon in the region of multiple diverticula suggestive of mild diverticulitis. No perforation or abscess. Small hiatal hernia. Otherwise unremarkable stomach. Small duodenal diverticula. No small bowel obstruction or inflammation. Normal appendix. Vascular/Lymphatic: Aortic atherosclerosis without aneurysm. Patent portal, splenic and mesenteric veins. Small periportal lymph nodes are typically reactive. No suspicious or enlarged abdominopelvic lymph nodes. Reproductive: Hysterectomy. Ovaries are tentatively visualized and normal. No adnexal mass. Other: Small fat containing umbilical hernia. No free air, free fluid, or intra-abdominal fluid collection. Musculoskeletal: Grade 1 anterolisthesis of L4 on L5 with associated degenerative disc disease and facet hypertrophy at this level. There is also degenerative disc disease with facet hypertrophy at L5-S1. No  lumbar compression deformity or fracture. No acute osseous findings. IMPRESSION: 1. Mild acute uncomplicated diverticulitis of the distal descending/proximal sigmoid colon. 2. Hepatic steatosis. 3. Small hiatal hernia. Aortic Atherosclerosis (ICD10-I70.0). Electronically Signed   By: Narda Rutherford M.D.   On: 07/29/2022 22:45   DG Chest 2 View  Result Date: 07/29/2022 CLINICAL DATA:  Chest pain, back pain EXAM: CHEST - 2 VIEW COMPARISON:  12/02/2021 FINDINGS: Cardiac size is within normal limits. Lung fields are clear of any infiltrates or pulmonary edema. There is no pleural effusion or pneumothorax. IMPRESSION: No active cardiopulmonary disease. Electronically Signed   By: Ernie Avena M.D.   On: 07/29/2022 20:13    Final Reassessment and Plan:   Objective evaluation resulted with no acute pathology.  CT scan does have evidence of diverticulitis without any complications.  She does have a history of abscess.  Had a prolonged shared medical decision making conversation with patient.  After IV fluids and antiemetics, she is grossly symptomatically improved.  She would be a candidate for conservative management.  However she has fear of recurrent abscess.  Given 8 days of symptoms with only slight improvement tonight, it would be reasonable to trial antibiotics for treatment of her diverticulitis.  Given our shared medical decision making conversation, she would like to proceed with these at this time.  Antibiotics prescribed, she does not want to start them till the morning and they were sent to her pharmacy.  She is currently symptomatically improved stable for outpatient care and management.  Disposition:  I have considered need for hospitalization, however, considering all of the above, I believe this patient is stable for discharge at this time.  Patient/family educated about specific return precautions for given chief complaint and symptoms.  Patient/family educated about follow-up with PCP.     Patient/family expressed understanding of return precautions and need for follow-up. Patient spoken to regarding all imaging and laboratory results and appropriate follow up for these results. All education provided in verbal form with additional information in written form. Time was  allowed for answering of patient questions. Patient discharged.    Emergency Department Medication Summary:   Medications  lactated ringers bolus 1,000 mL (0 mLs Intravenous Stopped 07/29/22 2319)  ketorolac (TORADOL) 15 MG/ML injection 15 mg (15 mg Intravenous Given 07/29/22 2200)  metoCLOPramide (REGLAN) injection 10 mg (10 mg Intravenous Given 07/29/22 2200)  iohexol (OMNIPAQUE) 300 MG/ML solution 100 mL (100 mLs Intravenous Contrast Given 07/29/22 2231)            Clinical Impression:  1. Left lower quadrant abdominal pain   2. Diverticulitis      Discharge   Final Clinical Impression(s) / ED Diagnoses Final diagnoses:  Left lower quadrant abdominal pain  Diverticulitis    Rx / DC Orders ED Discharge Orders          Ordered    ciprofloxacin (CIPRO) 500 MG tablet  Every 12 hours        07/29/22 2258    metroNIDAZOLE (FLAGYL) 500 MG tablet  2 times daily        07/29/22 2258    metoCLOPramide (REGLAN) 10 MG tablet  Every 6 hours        07/29/22 2258              Glyn Ade, MD 07/29/22 2327

## 2022-07-30 NOTE — Progress Notes (Signed)
Cardiology Office Note:    Date:  07/31/2022   ID:  Lori Lester, DOB [birth year], MRN [id number]  PCP:  Lois Huxley, PA   [place] Providers Cardiologist:  None     Referring MD: Harlan Stains, MD   CC: Angina prior to potential gall bladder surgery. Consulted for the evaluation of chest pain at the behest of Dr. Dema Severin  History of Present Illness:    Lori Lester is a 66 y.o. female with a hx of Tobacco abuse, HLD, and CP.  Had normal Cath in 2017. 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 She had nausea- but she had diverticulitis. She has abdominal and back pain- she has an upcoming HIDA.  Gets a neck ache that gets worse that is spontaneous in nature.  Wakes her up at night.  Sometimes comes on during her working at [employer]  No shortness of breath, DOE but rare lightheadedness with exertion.  No PND or orthopnea.  No weight gain, leg swelling , or abdominal swelling.  No syncope or near syncope . Notes  no palpitations or funny heart beats.      Past Medical History:  Diagnosis Date   Arthritis    Complication of anesthesia    Diverticulitis    Hypertension    PONV (postoperative nausea and vomiting)    Tobacco abuse    Tremor 12/12/2012    Past Surgical History:  Procedure Laterality Date   ABDOMINAL HYSTERECTOMY     BACK SURGERY     CARDIAC CATHETERIZATION N/A 12/03/2015   Procedure: Left Heart Cath and Coronary Angiography;  Surgeon: Troy Sine, MD;  Location: [facility];  Service: Cardiovascular;  Laterality: N/A;   ORIF ANKLE FRACTURE Right 04/04/2021   Procedure: OPEN REDUCTION INTERNAL FIXATION (ORIF) ANKLE FRACTURE;  Surgeon: Earlie Server, MD;  Location: [facility];  Service: Orthopedics;  Laterality: Right;    Current Medications: Current Meds  Medication Sig    ALPRAZolam (XANAX) 0.25 MG tablet Take 0.25 mg by mouth daily as needed for anxiety.   atorvastatin (LIPITOR) 10 MG tablet Take 1 tablet by mouth daily.   gabapentin (NEURONTIN) 300 MG capsule Take 300 mg by mouth as needed (foot pain).   LORazepam (ATIVAN) 0.5 MG tablet Take 0.5 mg by mouth 2 (two) times daily as needed.   meclizine (ANTIVERT) 25 MG tablet as needed for dizziness (vertigo).   metoprolol tartrate (LOPRESSOR) 100 MG tablet Take 1 tablet (100 mg total) by mouth once for 1 dose. Take 1 tablet (100 mg total) two hours prior to CT scan.   nitroGLYCERIN (NITROSTAT) 0.4 MG SL tablet Place 1 tablet (0.4 mg total) under the tongue every 5 (five) minutes as needed for chest pain.   omeprazole (PRILOSEC) 20 MG capsule Take 20 mg by mouth every morning.   venlafaxine XR (EFFEXOR-XR) 37.5 MG 24 hr capsule Take 37.5 mg by mouth daily.   Vitamin D, Ergocalciferol, (DRISDOL) 1.25 MG (50000 UNIT) CAPS capsule Take 50,000 Units by mouth once a week.     Allergies:   Amoxicillin, Ciprofloxacin, Codeine, and Sulfa antibiotics   Social History   Socioeconomic History   Marital status: Married    Spouse name: Not on file   Number of children: Not on file   Years of education: Not  on file   Highest education level: Not on file  Occupational History   Not on file  Tobacco Use   Smoking status: Former    Packs/day: 1.00    Types: Cigarettes    Quit date: 08/16/2021    Years since quitting: 0.9   Smokeless tobacco: Never  Vaping Use   Vaping Use: Never used  Substance and Sexual Activity   Alcohol use: Yes   Drug use: No   Sexual activity: Not on file  Other Topics Concern   Not on file  Social History Narrative   Not on file   Social Determinants of Health   Financial Resource Strain: Not on file  Food Insecurity: Not on file  Transportation Needs: Not on file  Physical Activity: Not on file  Stress: Not on file  Social Connections: Not on file    Family History: Mother  died from MI at 6.   ROS:   Please see the history of present illness.     All other systems reviewed and are negative.  EKGs/Labs/Other Studies Reviewed:    The following studies were reviewed today:  EKG:    07/29/22: SR 95  Cardiac Studies & Procedures   CARDIAC CATHETERIZATION  CARDIAC CATHETERIZATION 12/03/2015  Narrative  The left ventricular systolic function is normal.  Normal LV function with an ejection fraction of 55-60%.  Normal coronary arteries.  Findings Coronary Findings Diagnostic  Dominance: Right  Left Main Vessel was injected. Vessel is normal in caliber. Vessel is angiographically normal.  Left Anterior Descending Vessel was injected. Vessel is normal in caliber. Vessel is angiographically normal.  Ramus Intermedius Vessel was injected. Vessel is small. Vessel is angiographically normal.  Left Circumflex Vessel was injected. Vessel is normal in caliber. Vessel is angiographically normal.  Right Coronary Artery Vessel was injected. Vessel is normal in caliber. Vessel is angiographically normal.  Intervention  No interventions have been documented.     ECHOCARDIOGRAM  ECHOCARDIOGRAM COMPLETE 12/02/2015  Narrative *[facility]* [address], [place] [address] [phone]  ------------------------------------------------------------------- Transthoracic Echocardiography  Patient:    Lori, Lester MR #:       [id number] Study Date: 12/02/2015 Gender:     F Age:        43 Height:     163.8 cm Weight:     98 kg BSA:        2.16 m^2 Pt. Status: Room:       [place] SONOGRAPHER  Laverna Peace Reel, RDCS PERFORMING   [facility], Inpatient ADMITTING    Fuller Plan A ORDERING     Smith, Rondell A REFERRING    Smith, Rondell A  cc:  ------------------------------------------------------------------- LV EF: 55% -    60%  ------------------------------------------------------------------- Indications:      Chest pain 786.51.  ------------------------------------------------------------------- History:   PMH:   Palpitations.  Risk factors:  Current tobacco use. Dyslipidemia.  ------------------------------------------------------------------- Study Conclusions  - Left ventricle: The cavity size was normal. Systolic function was normal. The estimated ejection fraction was in the range of 55% to 60%. Wall motion was normal; there were no regional wall motion abnormalities. Doppler parameters are consistent with abnormal left ventricular relaxation (grade 1 diastolic dysfunction). - Mitral valve: Calcified annulus. - Atrial septum: No defect or patent foramen ovale was identified.  Transthoracic echocardiography.  M-mode, complete 2D, spectral Doppler, and color Doppler.  Birthdate:  Patient birthdate: 1955/09/29.  Age:  Patient is  66 yr old.  Sex:  Gender: female. BMI: 36.5 kg/m^2.  Blood pressure:     126/76  Patient status: Inpatient.  Study date:  Study date: 12/02/2015. Study time: 01:41 PM.  Location:  Bedside.  -------------------------------------------------------------------  ------------------------------------------------------------------- Left ventricle:  The cavity size was normal. Systolic function was normal. The estimated ejection fraction was in the range of 55% to 60%. Wall motion was normal; there were no regional wall motion abnormalities. Doppler parameters are consistent with abnormal left ventricular relaxation (grade 1 diastolic dysfunction).  ------------------------------------------------------------------- Aortic valve:   Trileaflet; normal thickness leaflets. Mobility was not restricted.  Doppler:  Transvalvular velocity was within the normal range. There was no stenosis. There was no  regurgitation.  ------------------------------------------------------------------- Aorta:  The aorta was normal, not dilated, and non-diseased. Aortic root: The aortic root was normal in size.  ------------------------------------------------------------------- Mitral valve:   Calcified annulus. Mobility was not restricted. Doppler:  Transvalvular velocity was within the normal range. There was no evidence for stenosis. There was no significant regurgitation.    Peak gradient (D): 3 mm Hg.  ------------------------------------------------------------------- Left atrium:  The atrium was normal in size.  ------------------------------------------------------------------- Atrial septum:  No defect or patent foramen ovale was identified.  ------------------------------------------------------------------- Right ventricle:  The cavity size was normal. Wall thickness was normal. Systolic function was normal.  ------------------------------------------------------------------- Pulmonic valve:    Doppler:  Transvalvular velocity was within the normal range. There was no evidence for stenosis.  ------------------------------------------------------------------- Tricuspid valve:   Structurally normal valve.    Doppler: Transvalvular velocity was within the normal range. There was trivial regurgitation.  ------------------------------------------------------------------- Pulmonary artery:   The main pulmonary artery was normal-sized. Systolic pressure was within the normal range.  ------------------------------------------------------------------- Right atrium:  The atrium was normal in size.  ------------------------------------------------------------------- Pericardium:  The pericardium was normal in appearance. There was no pericardial effusion.  ------------------------------------------------------------------- Systemic veins: Inferior vena cava: The vessel was normal in size.  The respirophasic diameter changes were in the normal range (>= 50%), consistent with normal central venous pressure.  ------------------------------------------------------------------- Post procedure conclusions Ascending Aorta:  - The aorta was normal, not dilated, and non-diseased.  ------------------------------------------------------------------- Measurements  Left ventricle                           Value        Reference LV ID, ED, PLAX chordal          (L)     35.2  mm     43 - 52 LV ID, ES, PLAX chordal                  24.7  mm     23 - 38 LV fx shortening, PLAX chordal           30    %      >=29 LV PW thickness, ED                      9.24  mm     --------- IVS/LV PW ratio, ED                      1.11         <=1.3 LV ejection fraction, 1-p A4C            56    %      --------- LV end-diastolic volume, 2-p  76    ml     --------- LV end-systolic volume, 2-p              35    ml     --------- LV ejection fraction, 2-p                54    %      --------- Stroke volume, 2-p                       41    ml     --------- LV end-diastolic volume/bsa, 2-p         35    ml/m^2 --------- LV end-systolic volume/bsa, 2-p          16    ml/m^2 --------- Stroke volume/bsa, 2-p                   18.9  ml/m^2 --------- LV e&', lateral                           7.29  cm/s   --------- LV E/e&', lateral                         10.85        --------- LV s&', lateral                           7     cm/s   --------- LV e&', medial                            6.09  cm/s   --------- LV E/e&', medial                          12.99        --------- LV e&', average                           6.69  cm/s   --------- LV E/e&', average                         11.82        ---------  Ventricular septum                       Value        Reference IVS thickness, ED                        10.3  mm     ---------  LVOT                                     Value        Reference LVOT ID,  S                               21    mm     --------- LVOT area  3.46  cm^2   ---------  Aorta                                    Value        Reference Aortic root ID, ED                       30    mm     ---------  Left atrium                              Value        Reference LA ID, A-P, ES                           33    mm     --------- LA ID/bsa, A-P                           1.53  cm/m^2 <=2.2 LA volume, S                             63.5  ml     --------- LA volume/bsa, S                         29.5  ml/m^2 --------- LA volume, ES, 1-p A4C                   73.6  ml     --------- LA volume/bsa, ES, 1-p A4C               34.1  ml/m^2 --------- LA volume, ES, 1-p A2C                   55.1  ml     --------- LA volume/bsa, ES, 1-p A2C               25.6  ml/m^2 ---------  Mitral valve                             Value        Reference Mitral E-wave peak velocity              79.1  cm/s   --------- Mitral A-wave peak velocity              107   cm/s   --------- Mitral deceleration time                 204   ms     150 - 230 Mitral peak gradient, D                  3     mm Hg  --------- Mitral E/A ratio, peak                   0.7          ---------  Pulmonary arteries                       Value        Reference PA pressure, S, DP  23    mm Hg  <=30  Tricuspid valve                          Value        Reference Tricuspid regurg peak velocity           221   cm/s   --------- Tricuspid peak RV-RA gradient            20    mm Hg  ---------  Systemic veins                           Value        Reference Estimated CVP                            3     mm Hg  ---------  Right ventricle                          Value        Reference TAPSE                                    27.2  mm     --------- RV pressure, S, DP                       23    mm Hg  <=30 RV s&', lateral, S                        12.5  cm/s    ---------  Legend: (L)  and  (H)  mark values outside specified reference range.  ------------------------------------------------------------------- Prepared and Electronically Authenticated by  Charlton Haws, M.D. 2017-04-17T15:09:12              Recent Labs: 03/23/2022: ALT 14 07/29/2022: BUN 11; Creatinine, Ser 0.73; Hemoglobin 14.4; Platelets 236; Potassium 4.2; Sodium 141  Recent Lipid Panel    Component Value Date/Time   CHOL 211 (H) 12/02/2015 0415   TRIG 117 12/02/2015 0415   HDL 37 (L) 12/02/2015 0415   CHOLHDL 5.7 12/02/2015 0415   VLDL 23 12/02/2015 0415   LDLCALC 151 (H) 12/02/2015 0415    Physical Exam:    VS:  BP 138/70   Pulse 81   Ht 5\' 6"  (1.676 m)   Wt 212 lb (96.2 kg)   SpO2 96%   BMI 34.22 kg/m     Wt Readings from Last 3 Encounters:  07/31/22 212 lb (96.2 kg)  07/29/22 208 lb (94.3 kg)  03/23/22 220 lb 0.3 oz (99.8 kg)    GEN:  Well nourished, well developed in no acute distress HEENT: Normal NECK: No JVD LYMPHATICS: No lymphadenopathy CARDIAC: RRR, soft murmur with no rubs, gallops RESPIRATORY:  Clear to auscultation without rales, wheezing or rhonchi  ABDOMEN: Soft, non-tender, non-distended MUSCULOSKELETAL:  No edema; No deformity  SKIN: Warm and dry NEUROLOGIC:  Alert and oriented x 3 PSYCHIATRIC:  Normal affect   ASSESSMENT:    1. Precordial chest pain   2. Tobacco abuse   3. Mixed hyperlipidemia   4. Family history of early CAD   5. Murmur     PLAN:    Precordial Pain Tobacco Use  FH of CAD HLD - CCTA with 100 of metoprolol - she is pre-contemplative to smoking cessation - she is pre-contemplative to start statin - will start ASA 81 mg - PRN Nitro-> if using a lot needs to transition to Imdur  Heart murmur - will get echo; Mitral valve was thickened on prior      Medication Adjustments/Labs and Tests Ordered: Current medicines are reviewed at length with the patient today.  Concerns regarding medicines are  outlined above.  Orders Placed This Encounter  Procedures   NM PET CT CARDIAC PERFUSION MULTI W/ABSOLUTE BLOODFLOW   Basic metabolic panel   ECHOCARDIOGRAM COMPLETE   Meds ordered this encounter  Medications   nitroGLYCERIN (NITROSTAT) 0.4 MG SL tablet    Sig: Place 1 tablet (0.4 mg total) under the tongue every 5 (five) minutes as needed for chest pain.    Dispense:  30 tablet    Refill:  3   metoprolol tartrate (LOPRESSOR) 100 MG tablet    Sig: Take 1 tablet (100 mg total) by mouth once for 1 dose. Take 1 tablet (100 mg total) two hours prior to CT scan.    Dispense:  1 tablet    Refill:  0    Patient Instructions  Medication Instructions:  Your physician has recommended you make the following change in your medication:   Nitroglycerin 0.4mg  as needed for chest pain  *If you need a refill on your cardiac medications before your next appointment, please call your pharmacy*   Lab Work: BMET If you have labs (blood work) drawn today and your tests are completely normal, you will receive your results only by: [place] (if you have MyChart) OR A paper copy in the mail If you have any lab test that is abnormal or we need to change your treatment, we will call you to review the results.   Testing/Procedures: ECHO Your physician has requested that you have an echocardiogram. Echocardiography is a painless test that uses sound waves to create images of your heart. It provides your doctor with information about the size and shape of your heart and how well your heart's chambers and valves are working. This procedure takes approximately one hour. There are no restrictions for this procedure. Please do NOT wear cologne, perfume, aftershave, or lotions (deodorant is allowed). Please arrive 15 minutes prior to your appointment time.   Follow-Up: At [facility], you and your health needs are our priority.  As part of our continuing mission to provide you with  exceptional heart care, we have created designated Provider Care Teams.  These Care Teams include your primary Cardiologist (physician) and Advanced Practice Providers (APPs -  Physician Assistants and Nurse Practitioners) who all work together to provide you with the care you need, when you need it.  We recommend signing up for the patient portal called "MyChart".  Sign up information is provided on this After Visit Summary.  MyChart is used to connect with patients for Virtual Visits (Telemedicine).  Patients are able to view lab/test results, encounter notes, upcoming appointments, etc.  Non-urgent messages can be sent to your provider as well.   To learn more about what you can do with MyChart, go to [email or web address].    Your next appointment:   3 month(s)  The format for your next appointment:   In Person  Provider:  APP or Dr Gasper Sells   Other Instructions   Your cardiac CT will be scheduled at one of the below locations:  [facility] [place], [place] [address] [phone]  Please arrive at the [facility] and Children's Entrance (Entrance C2) of [facility] 30 minutes prior to test start time. You can use the FREE valet parking offered at entrance C (encouraged to control the heart rate for the test)  Proceed to the [facility] Radiology Department (first floor) to check-in and test prep.  All radiology patients and guests should use entrance C2 at [facility], accessed from [facility], even though the hospital's physical address listed is [address].     Please follow these instructions carefully (unless otherwise directed):  We will administer nitroglycerin during this exam.   On the Night Before the Test: Be sure to Drink plenty of water. Do not consume any caffeinated/decaffeinated beverages or chocolate 12 hours prior to your test. Do not take any antihistamines 12 hours prior to your  test.  On the Day of the Test: Drink plenty of water until 1 hour prior to the test. Do not eat any food 1 hour prior to test. You may take your regular medications prior to the test.  Take metoprolol (Lopressor) two hours prior to test. HOLD Furosemide/Hydrochlorothiazide morning of the test. FEMALES- please wear underwire-free bra if available, avoid dresses & tight clothing         After the Test: Drink plenty of water. After receiving IV contrast, you may experience a mild flushed feeling. This is normal. On occasion, you may experience a mild rash up to 24 hours after the test. This is not dangerous. If this occurs, you can take Benadryl 25 mg and increase your fluid intake. If you experience trouble breathing, this can be serious. If it is severe call 911 IMMEDIATELY. If it is mild, please call our office. If you take any of these medications: Glipizide/Metformin, Avandament, Glucavance, please do not take 48 hours after completing test unless otherwise instructed.  We will call to schedule your test 2-4 weeks out understanding that some insurance companies will need an authorization prior to the service being performed.   For non-scheduling related questions, please contact the cardiac imaging nurse navigator should you have any questions/concerns: Marchia Bond, Cardiac Imaging Nurse Navigator Gordy Clement, Cardiac Imaging Nurse Navigator [facility] Direct Office Dial: [phone]   For scheduling needs, including cancellations and rescheduling, please call [place], [phone].         Signed, Werner Lean, MD  07/31/2022 5:03 PM    [place]

## 2022-07-31 ENCOUNTER — Encounter: Payer: Self-pay | Admitting: Internal Medicine

## 2022-07-31 ENCOUNTER — Ambulatory Visit: Payer: No Typology Code available for payment source | Attending: Internal Medicine | Admitting: Internal Medicine

## 2022-07-31 VITALS — BP 138/70 | HR 81 | Ht 66.0 in | Wt 212.0 lb

## 2022-07-31 DIAGNOSIS — R072 Precordial pain: Secondary | ICD-10-CM

## 2022-07-31 DIAGNOSIS — R011 Cardiac murmur, unspecified: Secondary | ICD-10-CM

## 2022-07-31 DIAGNOSIS — E782 Mixed hyperlipidemia: Secondary | ICD-10-CM

## 2022-07-31 DIAGNOSIS — R110 Nausea: Secondary | ICD-10-CM | POA: Diagnosis not present

## 2022-07-31 DIAGNOSIS — Z720 Tobacco use: Secondary | ICD-10-CM | POA: Diagnosis not present

## 2022-07-31 DIAGNOSIS — Z8249 Family history of ischemic heart disease and other diseases of the circulatory system: Secondary | ICD-10-CM

## 2022-07-31 DIAGNOSIS — Z8719 Personal history of other diseases of the digestive system: Secondary | ICD-10-CM | POA: Diagnosis not present

## 2022-07-31 MED ORDER — METOPROLOL TARTRATE 100 MG PO TABS: 100.0000 mg | ORAL_TABLET | Freq: Once | ORAL | 0 refills | Status: DC

## 2022-07-31 MED ORDER — NITROGLYCERIN 0.4 MG SL SUBL: 0.4000 mg | SUBLINGUAL_TABLET | SUBLINGUAL | 3 refills | Status: AC | PRN

## 2022-07-31 NOTE — Patient Instructions (Addendum)
Medication Instructions:  Your physician has recommended you make the following change in your medication:   Nitroglycerin 0.4mg  as needed for chest pain  *If you need a refill on your cardiac medications before your next appointment, please call your pharmacy*   Lab Work: BMET If you have labs (blood work) drawn today and your tests are completely normal, you will receive your results only by: MyChart Message (if you have MyChart) OR A paper copy in the mail If you have any lab test that is abnormal or we need to change your treatment, we will call you to review the results.   Testing/Procedures: ECHO Your physician has requested that you have an echocardiogram. Echocardiography is a painless test that uses sound waves to create images of your heart. It provides your doctor with information about the size and shape of your heart and how well your heart's chambers and valves are working. This procedure takes approximately one hour. There are no restrictions for this procedure. Please do NOT wear cologne, perfume, aftershave, or lotions (deodorant is allowed). Please arrive 15 minutes prior to your appointment time.   Follow-Up: At [facility], you and your health needs are our priority.  As part of our continuing mission to provide you with exceptional heart care, we have created designated Provider Care Teams.  These Care Teams include your primary Cardiologist (physician) and Advanced Practice Providers (APPs -  Physician Assistants and Nurse Practitioners) who all work together to provide you with the care you need, when you need it.  We recommend signing up for the patient portal called "MyChart".  Sign up information is provided on this After Visit Summary.  MyChart is used to connect with patients for Virtual Visits (Telemedicine).  Patients are able to view lab/test results, encounter notes, upcoming appointments, etc.  Non-urgent messages can be sent to your provider as  well.   To learn more about what you can do with MyChart, go to [email or web address].    Your next appointment:   3 month(s)  The format for your next appointment:   In Person  Provider:  APP or Dr Izora Ribas   Other Instructions   Your cardiac CT will be scheduled at one of the below locations:   [facility] [address] [place], [place] [address] [phone]  Please arrive at the [facility] and Children[facility] Entrance (Entrance C2) of [facility] 30 minutes prior to test start time. You can use the FREE valet parking offered at entrance C (encouraged to control the heart rate for the test)  Proceed to the [facility] Radiology Department (first floor) to check-in and test prep.  All radiology patients and guests should use entrance C2 at [facility], accessed from [facility], even though the hospital's physical address listed is [address]     Please follow these instructions carefully (unless otherwise directed):  We will administer nitroglycerin during this exam.   On the Night Before the Test: Be sure to Drink plenty of water. Do not consume any caffeinated/decaffeinated beverages or chocolate 12 hours prior to your test. Do not take any antihistamines 12 hours prior to your test.  On the Day of the Test: Drink plenty of water until 1 hour prior to the test. Do not eat any food 1 hour prior to test. You may take your regular medications prior to the test.  Take metoprolol (Lopressor) two hours prior to test. HOLD Furosemide/Hydrochlorothiazide morning of the test. FEMALES-  please wear underwire-free bra if available, avoid dresses & tight clothing         After the Test: Drink plenty of water. After receiving IV contrast, you may experience a mild flushed feeling. This is normal. On occasion, you may experience a mild rash up to 24 hours after the test. This is not dangerous. If this occurs, you can take  Benadryl 25 mg and increase your fluid intake. If you experience trouble breathing, this can be serious. If it is severe call 911 IMMEDIATELY. If it is mild, please call our office. If you take any of these medications: Glipizide/Metformin, Avandament, Glucavance, please do not take 48 hours after completing test unless otherwise instructed.  We will call to schedule your test 2-4 weeks out understanding that some insurance companies will need an authorization prior to the service being performed.   For non-scheduling related questions, please contact the cardiac imaging nurse navigator should you have any questions/concerns: Rockwell Alexandria, Cardiac Imaging Nurse Navigator Larey Brick, Cardiac Imaging Nurse Navigator [facility] Direct Office Dial: [phone]   For scheduling needs, including cancellations and rescheduling, please call [place], [phone].

## 2022-08-01 LAB — BASIC METABOLIC PANEL
BUN/Creatinine Ratio: 11 — ABNORMAL LOW (ref 12–28)
BUN: 8 mg/dL (ref 8–27)
CO2: 26 mmol/L (ref 20–29)
Calcium: 9.3 mg/dL (ref 8.7–10.3)
Chloride: 101 mmol/L (ref 96–106)
Creatinine, Ser: 0.72 mg/dL (ref 0.57–1.00)
Glucose: 112 mg/dL — ABNORMAL HIGH (ref 70–99)
Potassium: 3.9 mmol/L (ref 3.5–5.2)
Sodium: 139 mmol/L (ref 134–144)
eGFR: 93 mL/min/{1.73_m2} (ref >59)

## 2022-08-05 ENCOUNTER — Telehealth: Payer: Self-pay | Admitting: Internal Medicine

## 2022-08-05 NOTE — Telephone Encounter (Signed)
The patient has been notified of the result and verbalized understanding.  All questions (if any) were answered. Macie Burows, RN 08/05/2022 1:59 PM

## 2022-08-05 NOTE — Telephone Encounter (Signed)
Patient returning call for lab results. 

## 2022-08-07 ENCOUNTER — Other Ambulatory Visit (HOSPITAL_COMMUNITY): Payer: Self-pay | Admitting: Gastroenterology

## 2022-08-07 DIAGNOSIS — R110 Nausea: Secondary | ICD-10-CM

## 2022-08-18 ENCOUNTER — Encounter (HOSPITAL_COMMUNITY): Payer: Self-pay

## 2022-08-18 ENCOUNTER — Encounter (HOSPITAL_COMMUNITY): Payer: No Typology Code available for payment source

## 2022-09-01 ENCOUNTER — Ambulatory Visit (HOSPITAL_COMMUNITY): Payer: Commercial Managed Care - PPO | Attending: Internal Medicine

## 2022-09-01 DIAGNOSIS — Z8249 Family history of ischemic heart disease and other diseases of the circulatory system: Secondary | ICD-10-CM | POA: Insufficient documentation

## 2022-09-01 DIAGNOSIS — R072 Precordial pain: Secondary | ICD-10-CM | POA: Diagnosis not present

## 2022-09-01 DIAGNOSIS — E782 Mixed hyperlipidemia: Secondary | ICD-10-CM | POA: Diagnosis not present

## 2022-09-01 DIAGNOSIS — R011 Cardiac murmur, unspecified: Secondary | ICD-10-CM | POA: Insufficient documentation

## 2022-09-01 LAB — ECHOCARDIOGRAM COMPLETE
Area-P 1/2: 3.03 cm2
S' Lateral: 2.7 cm

## 2022-09-01 MED ORDER — PERFLUTREN LIPID MICROSPHERE
3.0000 mL | INTRAVENOUS | Status: AC | PRN
  Administered 2022-09-01: 3 mL via INTRAVENOUS

## 2022-09-03 ENCOUNTER — Telehealth: Payer: Self-pay | Admitting: Internal Medicine

## 2022-09-03 NOTE — Telephone Encounter (Signed)
Left a message informing pt that MD has not reviewed results of Echocardiogram.  Advised to call the office with questions.

## 2022-09-03 NOTE — Telephone Encounter (Signed)
Follow Up:      Patient wants to know if her Echo result is ready from Tuesday(09-01-22)?

## 2022-09-07 NOTE — Telephone Encounter (Signed)
Results of Echocardiogram reviewed today.  Please see result note.

## 2022-09-25 DIAGNOSIS — F411 Generalized anxiety disorder: Secondary | ICD-10-CM | POA: Diagnosis not present

## 2022-10-06 DIAGNOSIS — D175 Benign lipomatous neoplasm of intra-abdominal organs: Secondary | ICD-10-CM | POA: Diagnosis not present

## 2022-10-06 DIAGNOSIS — K649 Unspecified hemorrhoids: Secondary | ICD-10-CM | POA: Diagnosis not present

## 2022-10-06 DIAGNOSIS — K449 Diaphragmatic hernia without obstruction or gangrene: Secondary | ICD-10-CM | POA: Diagnosis not present

## 2022-10-06 DIAGNOSIS — K5732 Diverticulitis of large intestine without perforation or abscess without bleeding: Secondary | ICD-10-CM | POA: Diagnosis not present

## 2022-10-06 DIAGNOSIS — K5730 Diverticulosis of large intestine without perforation or abscess without bleeding: Secondary | ICD-10-CM | POA: Diagnosis not present

## 2022-10-06 DIAGNOSIS — K635 Polyp of colon: Secondary | ICD-10-CM | POA: Diagnosis not present

## 2022-10-06 DIAGNOSIS — R110 Nausea: Secondary | ICD-10-CM | POA: Diagnosis not present

## 2022-10-06 DIAGNOSIS — K2930 Chronic superficial gastritis without bleeding: Secondary | ICD-10-CM | POA: Diagnosis not present

## 2022-10-19 ENCOUNTER — Encounter (HOSPITAL_COMMUNITY): Payer: Self-pay | Admitting: Internal Medicine

## 2022-10-19 DIAGNOSIS — K5732 Diverticulitis of large intestine without perforation or abscess without bleeding: Secondary | ICD-10-CM | POA: Diagnosis not present

## 2022-10-19 DIAGNOSIS — K5730 Diverticulosis of large intestine without perforation or abscess without bleeding: Secondary | ICD-10-CM | POA: Diagnosis not present

## 2022-10-19 DIAGNOSIS — K449 Diaphragmatic hernia without obstruction or gangrene: Secondary | ICD-10-CM | POA: Diagnosis not present

## 2022-10-19 DIAGNOSIS — R110 Nausea: Secondary | ICD-10-CM | POA: Diagnosis not present

## 2022-10-19 DIAGNOSIS — D175 Benign lipomatous neoplasm of intra-abdominal organs: Secondary | ICD-10-CM | POA: Diagnosis not present

## 2022-10-19 DIAGNOSIS — K649 Unspecified hemorrhoids: Secondary | ICD-10-CM | POA: Diagnosis not present

## 2022-10-19 DIAGNOSIS — K2930 Chronic superficial gastritis without bleeding: Secondary | ICD-10-CM | POA: Diagnosis not present

## 2022-10-19 DIAGNOSIS — K635 Polyp of colon: Secondary | ICD-10-CM | POA: Diagnosis not present

## 2022-10-23 DIAGNOSIS — F411 Generalized anxiety disorder: Secondary | ICD-10-CM | POA: Diagnosis not present

## 2022-10-23 DIAGNOSIS — R350 Frequency of micturition: Secondary | ICD-10-CM | POA: Diagnosis not present

## 2022-11-03 ENCOUNTER — Telehealth (HOSPITAL_COMMUNITY): Payer: Self-pay | Admitting: Internal Medicine

## 2022-11-03 NOTE — Telephone Encounter (Signed)
Just an FYI. We have made several attempts to contact this patient including sending a letter to schedule or reschedule their Cardiac PET CT. We will be removing the patient from the CTWQ.     10/19/22 MAILED LETTER [facility]  10/19/22 [facility] to schedule x 3 @ 1:00PM/[facility]  10/14/22 [facility] to schedule X 2 @ 1:11/[facility]  10/08/22 [facility] to schedule @ 3:30/[facility]      Thank you

## 2022-11-05 NOTE — Progress Notes (Deleted)
Cardiology Office Note:    Date:  11/05/2022   ID:  Lori Lester, DOB 05-21-1956, MRN [id number]  PCP:  Lois Huxley, PA   [facility] Providers Cardiologist:  None     Referring MD: Lois Huxley, PA   CC: follow up CP  History of Present Illness:    Lori Lester is a 67 y.o. female with a hx of Tobacco abuse, HLD, and CP.  Had normal Cath in 2017. After long discussion she does agree that her cath was done here. 2023: Normal LVEF.  We had sent information discussion surgical risk ***  Patient notes that (s)he is doing ***.   Since last visit notes *** . There are no*** interval hospital/ED visit.    No chest pain or pressure ***.  No SOB/DOE*** and no PND/Orthopnea***.  No weight gain or leg swelling***.  No palpitations or syncope ***.  Ambulatory blood pressure ***.    Past Medical History:  Diagnosis Date   Arthritis    Complication of anesthesia    Diverticulitis    Hypertension    PONV (postoperative nausea and vomiting)    Tobacco abuse    Tremor 12/12/2012    Past Surgical History:  Procedure Laterality Date   ABDOMINAL HYSTERECTOMY     BACK SURGERY     CARDIAC CATHETERIZATION N/A 12/03/2015   Procedure: Left Heart Cath and Coronary Angiography;  Surgeon: Troy Sine, MD;  Location: [facility];  Service: Cardiovascular;  Laterality: N/A;   ORIF ANKLE FRACTURE Right 04/04/2021   Procedure: OPEN REDUCTION INTERNAL FIXATION (ORIF) ANKLE FRACTURE;  Surgeon: Earlie Server, MD;  Location: [facility];  Service: Orthopedics;  Laterality: Right;    Current Medications: No outpatient medications have been marked as taking for the 11/09/22 encounter (Appointment) with Werner Lean, MD.     Allergies:   Amoxicillin, Ciprofloxacin, Codeine, and Sulfa antibiotics   Social History   Socioeconomic History   Marital status: Married    Spouse name: Not on file   Number of children: Not on file   Years of education: Not  on file   Highest education level: Not on file  Occupational History   Not on file  Tobacco Use   Smoking status: Former    Packs/day: 1    Types: Cigarettes    Quit date: 08/16/2021    Years since quitting: 1.2   Smokeless tobacco: Never  Vaping Use   Vaping Use: Never used  Substance and Sexual Activity   Alcohol use: Yes   Drug use: No   Sexual activity: Not on file  Other Topics Concern   Not on file  Social History Narrative   Not on file   Social Determinants of Health   Financial Resource Strain: Not on file  Food Insecurity: Not on file  Transportation Needs: Not on file  Physical Activity: Not on file  Stress: Not on file  Social Connections: Not on file    Family History: Mother died from MI at 40.   ROS:   Please see the history of present illness.     All other systems reviewed and are negative.  EKGs/Labs/Other Studies Reviewed:    The following studies were reviewed today:  EKG:   07/29/22: SR 95  Cardiac Studies & Procedures   CARDIAC CATHETERIZATION  CARDIAC CATHETERIZATION 12/03/2015  Narrative  The left ventricular systolic function is normal.  Normal LV function with an ejection fraction of 55-60%.  Normal  coronary arteries.  Findings Coronary Findings Diagnostic  Dominance: Right  Left Main Vessel was injected. Vessel is normal in caliber. Vessel is angiographically normal.  Left Anterior Descending Vessel was injected. Vessel is normal in caliber. Vessel is angiographically normal.  Ramus Intermedius Vessel was injected. Vessel is small. Vessel is angiographically normal.  Left Circumflex Vessel was injected. Vessel is normal in caliber. Vessel is angiographically normal.  Right Coronary Artery Vessel was injected. Vessel is normal in caliber. Vessel is angiographically normal.  Intervention  No interventions have been documented.     ECHOCARDIOGRAM  ECHOCARDIOGRAM COMPLETE 09/01/2022  Narrative ECHOCARDIOGRAM  REPORT    Patient Name:   Lori Lester Date of Exam: 09/01/2022 Medical Rec #:  [id number]           Height:       66.0 in Accession #:    [id number]          Weight:       212.0 lb Date of Birth:  [birth year]          BSA:          2.050 m Patient Age:    74 years            BP:           138/70 mmHg Patient Gender: F                   HR:           68 bpm. Exam Location:  [facility]  Procedure: 2D Echo, Cardiac Doppler, Color Doppler and Intracardiac Opacification Agent  Indications:    R01.1 Murmur  History:        Patient has prior history of Echocardiogram examinations, most recent 12/02/2015. Signs/Symptoms:Murmur and Chest Pain; Risk Factors:Dyslipidemia and Former Smoker.  Sonographer:    Coralyn Helling RDCS Referring Phys: [id number] [facility] [place]   Sonographer Comments: Technically difficult study due to poor echo windows. IMPRESSIONS   1. Left ventricular ejection fraction, by estimation, is 55 to 60%. The left ventricle has normal function. The left ventricle has no regional wall motion abnormalities. Left ventricular diastolic parameters are consistent with Grade I diastolic dysfunction (impaired relaxation). 2. Right ventricular systolic function is normal. The right ventricular size is normal. There is normal pulmonary artery systolic pressure. The estimated right ventricular systolic pressure is Q000111Q mmHg. 3. Left atrial size was mildly dilated. 4. The mitral valve is normal in structure. No evidence of mitral valve regurgitation. No evidence of mitral stenosis. Moderate mitral annular calcification. 5. The aortic valve is tricuspid. Aortic valve regurgitation is not visualized. No aortic stenosis is present. 6. The inferior vena cava is normal in size with greater than 50% respiratory variability, suggesting right atrial pressure of 3 mmHg.  FINDINGS Left Ventricle: Left ventricular ejection fraction, by estimation, is 55 to 60%. The left ventricle  has normal function. The left ventricle has no regional wall motion abnormalities. Definity contrast agent was given IV to delineate the left ventricular endocardial borders. The left ventricular internal cavity size was normal in size. There is no left ventricular hypertrophy. Left ventricular diastolic parameters are consistent with Grade I diastolic dysfunction (impaired relaxation).  Right Ventricle: The right ventricular size is normal. No increase in right ventricular wall thickness. Right ventricular systolic function is normal. There is normal pulmonary artery systolic pressure. The tricuspid regurgitant velocity is 2.33 m/s, and with an assumed right atrial pressure of 3 mmHg, the estimated right ventricular  systolic pressure is Q000111Q mmHg.  Left Atrium: Left atrial size was mildly dilated.  Right Atrium: Right atrial size was normal in size.  Pericardium: There is no evidence of pericardial effusion.  Mitral Valve: The mitral valve is normal in structure. Moderate mitral annular calcification. No evidence of mitral valve regurgitation. No evidence of mitral valve stenosis.  Tricuspid Valve: The tricuspid valve is normal in structure. Tricuspid valve regurgitation is trivial.  Aortic Valve: The aortic valve is tricuspid. Aortic valve regurgitation is not visualized. No aortic stenosis is present.  Pulmonic Valve: The pulmonic valve was normal in structure. Pulmonic valve regurgitation is not visualized.  Aorta: The aortic root is normal in size and structure.  Venous: The inferior vena cava is normal in size with greater than 50% respiratory variability, suggesting right atrial pressure of 3 mmHg.  IAS/Shunts: No atrial level shunt detected by color flow Doppler.   LEFT VENTRICLE PLAX 2D LVIDd:         4.00 cm   Diastology LVIDs:         2.70 cm   LV e' medial:    7.51 cm/s LV PW:         1.00 cm   LV E/e' medial:  16.1 LV IVS:        1.20 cm   LV e' lateral:   10.60  cm/s LVOT diam:     2.00 cm   LV E/e' lateral: 11.4 LV SV:         67 LV SV Index:   33 LVOT Area:     3.14 cm   RIGHT VENTRICLE             IVC RV S prime:     15.20 cm/s  IVC diam: 1.60 cm TAPSE (M-mode): 2.1 cm RVSP:           24.7 mmHg  LEFT ATRIUM             Index        RIGHT ATRIUM           Index LA diam:        4.50 cm 2.20 cm/m   RA Pressure: 3.00 mmHg LA Vol (A2C):   54.1 ml 26.39 ml/m  RA Area:     11.20 cm LA Vol (A4C):   74.0 ml 36.10 ml/m  RA Volume:   23.50 ml  11.46 ml/m LA Biplane Vol: 65.3 ml 31.85 ml/m AORTIC VALVE LVOT Vmax:   88.60 cm/s LVOT Vmean:  64.400 cm/s LVOT VTI:    0.214 m  AORTA Ao Root diam: 3.00 cm Ao Asc diam:  3.20 cm  MITRAL VALVE                TRICUSPID VALVE MV Area (PHT): 3.03 cm     TR Peak grad:   21.7 mmHg MV Decel Time: 250 msec     TR Vmax:        233.00 cm/s MV E velocity: 121.00 cm/s  Estimated RAP:  3.00 mmHg MV A velocity: 132.00 cm/s  RVSP:           24.7 mmHg MV E/A ratio:  0.92 SHUNTS Systemic VTI:  0.21 m Systemic Diam: 2.00 cm  Dalton McleanMD Electronically signed by Franki Monte Signature Date/Time: 09/01/2022/6:10:09 PM    Final              Recent Labs: 03/23/2022: ALT 14 07/29/2022: Hemoglobin 14.4; Platelets 236 07/31/2022: BUN 8; Creatinine, Ser 0.72;  Potassium 3.9; Sodium 139  Recent Lipid Panel    Component Value Date/Time   CHOL 211 (H) 12/02/2015 0415   TRIG 117 12/02/2015 0415   HDL 37 (L) 12/02/2015 0415   CHOLHDL 5.7 12/02/2015 0415   VLDL 23 12/02/2015 0415   LDLCALC 151 (H) 12/02/2015 0415    Physical Exam:    VS:  There were no vitals taken for this visit.    Wt Readings from Last 3 Encounters:  07/31/22 212 lb (96.2 kg)  07/29/22 208 lb (94.3 kg)  03/23/22 220 lb 0.3 oz (99.8 kg)    GEN:  Well nourished, well developed in no acute distress HEENT: Normal NECK: No JVD CARDIAC: RRR, soft murmur with no rubs, gallops RESPIRATORY:  Clear to auscultation without  rales, wheezing or rhonchi  ABDOMEN: Soft, non-tender, non-distended MUSCULOSKELETAL:  No edema; No deformity  SKIN: Warm and dry NEUROLOGIC:  Alert and oriented x 3 PSYCHIATRIC:  Normal affect   ASSESSMENT:    No diagnosis found.  PLAN:    Precordial Pain Tobacco Use FH of CAD HLD - she is pre-contemplative to smoking cessation - she is pre-contemplative to start statin - ASA 81 mg - PRN Nitro-> if using a lot needs to transition to Imdur  Stable systolic murmur     Medication Adjustments/Labs and Tests Ordered: Current medicines are reviewed at length with the patient today.  Concerns regarding medicines are outlined above.  No orders of the defined types were placed in this encounter.  No orders of the defined types were placed in this encounter.   There are no Patient Instructions on file for this visit.   Signed, Werner Lean, MD  11/05/2022 10:21 AM    [place]

## 2022-11-09 ENCOUNTER — Ambulatory Visit: Payer: No Typology Code available for payment source | Attending: Internal Medicine | Admitting: Internal Medicine

## 2022-11-25 DIAGNOSIS — H25813 Combined forms of age-related cataract, bilateral: Secondary | ICD-10-CM | POA: Diagnosis not present

## 2022-11-25 DIAGNOSIS — H5202 Hypermetropia, left eye: Secondary | ICD-10-CM | POA: Diagnosis not present

## 2022-11-25 DIAGNOSIS — H5211 Myopia, right eye: Secondary | ICD-10-CM | POA: Diagnosis not present

## 2023-01-06 DIAGNOSIS — N390 Urinary tract infection, site not specified: Secondary | ICD-10-CM | POA: Diagnosis not present

## 2023-01-06 DIAGNOSIS — R350 Frequency of micturition: Secondary | ICD-10-CM | POA: Diagnosis not present

## 2023-02-22 ENCOUNTER — Other Ambulatory Visit: Payer: Self-pay | Admitting: Family Medicine

## 2023-02-22 DIAGNOSIS — E2839 Other primary ovarian failure: Secondary | ICD-10-CM

## 2023-05-19 ENCOUNTER — Encounter: Payer: Self-pay | Admitting: *Deleted

## 2023-05-20 ENCOUNTER — Encounter: Payer: Self-pay | Admitting: Neurology

## 2023-05-20 ENCOUNTER — Ambulatory Visit (INDEPENDENT_AMBULATORY_CARE_PROVIDER_SITE_OTHER): Payer: Commercial Managed Care - PPO | Admitting: Neurology

## 2023-05-20 VITALS — BP 131/74 | HR 79 | Ht 63.0 in | Wt 198.8 lb

## 2023-05-20 DIAGNOSIS — R351 Nocturia: Secondary | ICD-10-CM

## 2023-05-20 DIAGNOSIS — Z820 Family history of epilepsy and other diseases of the nervous system: Secondary | ICD-10-CM | POA: Diagnosis not present

## 2023-05-20 DIAGNOSIS — R0683 Snoring: Secondary | ICD-10-CM | POA: Diagnosis not present

## 2023-05-20 DIAGNOSIS — G250 Essential tremor: Secondary | ICD-10-CM

## 2023-05-20 DIAGNOSIS — F419 Anxiety disorder, unspecified: Secondary | ICD-10-CM | POA: Diagnosis not present

## 2023-05-20 DIAGNOSIS — R519 Headache, unspecified: Secondary | ICD-10-CM

## 2023-05-20 DIAGNOSIS — G479 Sleep disorder, unspecified: Secondary | ICD-10-CM

## 2023-05-20 MED ORDER — PROPRANOLOL HCL 10 MG PO TABS: 10.0000 mg | ORAL_TABLET | Freq: Three times a day (TID) | ORAL | 3 refills | Status: AC

## 2023-05-20 NOTE — Progress Notes (Signed)
Subjective:    Patient ID: Lori Lester is a 67 y.o. female.  HPI    HPI:   Dear Lori Lester,   I saw your patient, Lori Lester, upon your kind request and my neurologic clinic today for evaluation of her tremor.  The patient is unaccompanied today.  As you know, Lori Lester is a 67 year old female with an underlying medical history of prediabetes, hyperlipidemia, vitamin D deficiency, osteoarthritis, reflux disease, depression, anxiety, psoriasis, diverticulitis, hypertension, obesity, and smoking, who reports a longstanding history of hand tremors.  She has progressed over time.  It started in both hands but now she also has an intermittent head tremor and sometimes she feels like her overall body is trembling.  Nervousness and anxiety are certainly triggers for her.  In addition, she does not always sleep well.  She does snore per her husband, her son has sleep apnea.  She has never had a sleep study.  She has quite a bit of caffeine in the form of soda, usually 4 12 ounce bottles per day on average.  She drinks alcohol occasionally on the weekends.  She smokes about a pack over one week to week and a half.  She does not always wake up rested.  Her Epworth sleepiness score is 8/24, fatigue severity score is 40/63. She denies morning headaches.  She has nocturia about 2-3 times per average night.  She has some trouble falling asleep.  Her dad had a tremor and paternal grandmother also had a tremor.  She had 1 brother who died at the age of 61 with a heart attack.  She has 4 children, none with tremors.  She recently started sertraline.  She had a recent eye exam.  She has had for the past year intermittent headaches in the back of her head, sometimes radiating forward, into the right eye area.  She has not had any sudden onset of one-sided weakness or numbness or tingling or droopy face or slurring of speech.  Recent blood work from 02/19/2023 was reviewed, BUN and creatinine were fine, vitamin  D borderline low, elevated cholesterol, TSH not done.  She is currently no longer on Lipitor.  She has blood work pending for next week.   I reviewed your office note from 02/19/2023.  Of note, she is currently on sertraline 100 mg daily, she also takes lorazepam as needed for anxiety.  She has a longstanding history of tremor and previously took propranolol but it made her sleepy.  According to your office note she also tried Topamax but it made her drowsy.   She was noted to have a rest tremor.  I had evaluated her several years ago for tremor.  Of note, she had a head CT without contrast on 12/05/2012 for indication of headache and tremors, I reviewed the results: Impression: No acute intracranial abnormality.  Previously (copied from previous notes for reference):    12/12/2012: 66 year old left-handed woman with an underlying medical history of chronic back pain, arthritis, anxiety, irritable bowel syndrome, chronic low back pain, hyperlipidemia, hypertension, headaches, reflux disease, and diverticular disease, who has been experiencing a bilateral upper extremity tremor for the past few weeks. She presented to the emergency room on 12/05/2012 with a presenting complaint of diffuse body tremors as well as palpitations. This was fairly acute in onset associated with a right posterior headache which was intermittent. She denied any one-sided weakness, numbness, tingling, facial droop, slurring of speech, hearing loss, tinnitus. She does report that her father has  tremors. She had a head CT on 12/05/2012 and I reviewed the report which showed no acute intracranial abnormalities. Her tremors improved after she was given Ativan in the emergency room. Lab results for review at which included a unremarkable CBC, and unremarkable BMP, troponin was negative, EKG was also done and reviewed. She is currently on Exforge and she was taken off Wellbutrin, meclizine, Lamisil, atorvastatin, Nexium, lorazepam about a  week ago, but so far, it has not made a difference in her tremor. She has a resting tremor and action tremor and postural tremor, but has not had difficulty with her ADLs and has noted some tremulousness in her handwriting. She has had some blood work on 11/04/12 which I reviewed. She had CBC, CMP, TSH, FSH. As I understand, she has another appointment tomorrow a neurologist for additional blood work off of most of her medications. She is worried about her fluctuating blood pressure values. She denies any additional stressors. 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 Her husband works out of state and is only home once a week. Her father lives with them but she indicates no additional new stressors. She indicates that she has been going through menopausal symptoms. Her father has a tremor that also affects his head. She is not sure what he has been diagnosed with.   Her Past Medical History Is Significant For: Past Medical History:  Diagnosis Date   Anxiety    Arthritis    Complication of anesthesia    Depression    Diverticulitis    Estrogen deficiency    GERD (gastroesophageal reflux disease)    Hyperlipidemia    Hypertension    Obesity    Osteoarthritis    Overactive bladder    PONV (postoperative nausea and vomiting)    Prediabetes    Tobacco abuse    Tremor 12/12/2012   Vitamin D deficiency     Her Past Surgical History Is Significant For: Past Surgical History:  Procedure Laterality Date   ABDOMINAL HYSTERECTOMY     BACK SURGERY     CARDIAC CATHETERIZATION N/A 12/03/2015   Procedure: Left Heart Cath and Coronary Angiography;  Surgeon: Lennette Bihari, MD;  Location: [facility];  Service: Cardiovascular;  Laterality: N/A;   ORIF ANKLE FRACTURE Right 04/04/2021   Procedure: OPEN REDUCTION INTERNAL FIXATION (ORIF) ANKLE FRACTURE;  Surgeon: Frederico Hamman, MD;  Location: [facility];  Service: Orthopedics;  Laterality: Right;    WISDOM TOOTH EXTRACTION      Her Family History Is Significant For: Family History  Problem Relation Age of Onset   Parkinson's disease Neg Hx    Tremor Neg Hx     Her Social History Is Significant For: Social History   Socioeconomic History   Marital status: Married    Spouse name: Not on file   Number of children: Not on file   Years of education: Not on file   Highest education level: Not on file  Occupational History   Not on file  Tobacco Use   Smoking status: Former    Current packs/day: 0.00    Types: Cigarettes    Quit date: 08/16/2021    Years since quitting: 1.7   Smokeless tobacco: Never  Vaping Use   Vaping status: Never Used  Substance and Sexual Activity   Alcohol use: Yes   Drug use: No   Sexual activity: Not on file  Other Topics Concern  Not on file  Social History Narrative   Not on file   Social Determinants of Health   Financial Resource Strain: Not on file  Food Insecurity: Not on file  Transportation Needs: Not on file  Physical Activity: Not on file  Stress: Not on file  Social Connections: Not on file    Her Allergies Are:  Allergies  Allergen Reactions   Amoxicillin Nausea And Vomiting   Ciprofloxacin Hives and Nausea Only   Codeine Nausea And Vomiting   Duloxetine     Not effective   Sulfa Antibiotics Nausea And Vomiting   Venlafaxine Nausea Only  :   Her Current Medications Are:  Outpatient Encounter Medications as of 05/20/2023  Medication Sig   meclizine (ANTIVERT) 25 MG tablet Take 25 mg by mouth 3 (three) times daily as needed for dizziness (vertigo).   omeprazole (PRILOSEC) 40 MG capsule Take 40 mg by mouth daily.   sertraline (ZOLOFT) 100 MG tablet Take 100 mg by mouth daily.   Vitamin D, Ergocalciferol, (DRISDOL) 1.25 MG (50000 UNIT) CAPS capsule Take 50,000 Units by mouth once a week.   ALPRAZolam (XANAX) 0.25 MG tablet Take 0.25 mg by mouth daily as needed for anxiety.   LORazepam (ATIVAN) 0.5 MG tablet Take 0.5  mg by mouth 2 (two) times daily as needed.   nitroGLYCERIN (NITROSTAT) 0.4 MG SL tablet Place 1 tablet (0.4 mg total) under the tongue every 5 (five) minutes as needed for chest pain. (Patient not taking: Reported on 05/20/2023)   ondansetron (ZOFRAN) 8 MG tablet Take 8 mg by mouth every 8 (eight) hours as needed for nausea or vomiting. (Patient not taking: Reported on 05/20/2023)   promethazine (PHENERGAN) 12.5 MG tablet Take 12.5 mg by mouth every 8 (eight) hours as needed for nausea or vomiting. (Patient not taking: Reported on 05/20/2023)   No facility-administered encounter medications on file as of 05/20/2023.  :  Review of Systems:  Out of a complete 14 point review of systems, all are reviewed and negative with the exception of these symptoms as listed below:   Review of Systems  Neurological:        Pt here for tremors Pt states tremors in both hands,body,and head     Objective:  Neurological Exam  Physical Exam Physical Examination:   Vitals:   05/20/23 0806  BP: 131/74  Pulse: 79    General Examination: The patient is a very pleasant 67 y.o. female in no acute distress. She appears well-developed and well-nourished and well groomed.   HEENT: Normocephalic, atraumatic, pupils are equal, round and reactive to light and accommodation. Extraocular tracking is good without limitation to gaze excursion or nystagmus noted. Normal smooth pursuit is noted. Hearing is grossly intact. Face is symmetric with normal facial animation and normal facial sensation. Speech is clear with no dysarthria noted. There is no hypophonia. There is a mild and intermittent head bobbing.  Neck is supple with full range of motion. There are no carotid bruits on auscultation. Oropharynx exam reveals: adequate dental hygiene and mild airway crowding, due to narrow airway entry. Mallampati is class II. Tongue protrudes centrally and palate elevates symmetrically.     Chest: Clear to auscultation without  wheezing, rhonchi or crackles noted.   Heart: S1+S2+0, regular and normal without murmurs, rubs or gallops noted.    Abdomen: Soft, non-tender and non-distended.   Extremities: There is no pitting edema in the distal lower extremities bilaterally. Pedal pulses are intact.   Skin: Warm and  dry without trophic changes noted. There are no varicose veins.   Musculoskeletal: exam reveals prominent arthritic changes in both hands.  Right ankle wider than left.     Neurologically:  Mental status: The patient is awake, alert and oriented in all 4 spheres. Her memory, attention, language and knowledge are appropriate. There is no aphasia, agnosia, apraxia or anomia. Speech is clear with normal prosody and enunciation. Thought process is linear. Mood is congruent and affect is normal.  Cranial nerves are as described above under HEENT exam. In addition, shoulder shrug is normal with equal shoulder height noted. Motor exam: Normal bulk, strength and tone is noted. There is no drift, or rebound. She has a a very slight intermittent resting tremor.  She has a mild postural and action tremor in both upper extremities.    On 05/20/2023: On Archimedes spiral drawing she has a mild tremor with the left and mild to moderate with the right.  Her handwriting is not micrographic and legible but with mild trembling.  She writes with her left hand.   Fine motor skills including finger taps and hand movements and rapid alternating patting are not in keeping with a decrement in amplitude. Foot agility and foot taps are fairly normal bilaterally. Romberg is negative. Reflexes are 2+ throughout.  Toes are downgoing bilaterally. Cerebellar testing shows no dysmetria or intention tremor on finger to nose testing. There is no truncal or gait ataxia.  Normal finger-to-nose, normal heel-to-shin. Sensory exam is intact to light touch.  Gait, station and balance are unremarkable. No veering to one side is noted. No leaning to  one side is noted. Posture is age-appropriate and stance is narrow based. No problems turning are noted. She has trouble with tandem walk.  Assessment and Plan:     In summary, Lori Lester is a 67 year old female with an underlying medical history of prediabetes, hyperlipidemia, vitamin D deficiency, osteoarthritis, reflux disease, depression, anxiety, psoriasis, diverticulitis, hypertension, obesity, and smoking, who presents for evaluation of her tremor of many years duration.  History, family history and examination are in keeping with essential tremor.  She has previously tried a beta-blocker, unclear dose, she is agreeable to trying it again.  We talked about possible side effects and limitations of medications and the fact that she could have sedation or sleepiness or lethargy from it as well as low heart rate and blood pressure.  We will start low at 10 mg strength and build up to 3 times daily, start with once daily.  We can consider Mysoline next.   Examination is not in keeping with parkinsonism.  She is reassured in that regard.  I would like to proceed with a brain MRI with and without contrast as she also reports recurrent headaches.  I would like to rule out a structural cause.  She is advised to proceed with a sleep study as she does not sleep well and has a family history of sleep apnea, reports nocturia and sleep deprivation or poor sleep consolidation or poor sleep quality can cause tremors to be worse. I will order home sleep test and we will consider AutoPap therapy if she has obstructive sleep apnea.  She would be willing to try.  We talked about the importance of good hydration, limiting caffeine as caffeine can also feed into tremors.  She is advised to work on smoking cessation as well.  She is advised to get her thyroid function checked with her next blood work with your office.  We will plan to follow-up after testing, we will keep her posted as to her test results by phone  call in the interim.  I answered all her questions today and she was in agreement.  This was an extended visit of over 60 minutes with extended chart review, addressing multiple problems and counseling and coordination of care.  Thank you very much for allowing me to participate in the care of this nice patient. If I can be of any further assistance to you please do not hesitate to call me at [phone].   Sincerely,     Huston Foley, MD, PhD

## 2023-05-20 NOTE — Patient Instructions (Addendum)
It was nice to see you again. Here is what we discussed today:   We will do a brain scan, called MRI and call you with the test results. We will have to schedule you for this on a separate date. This test requires authorization from your insurance, and we will take care of the insurance process. Please have your PCP check your thyroid function with the next blood work.  You have essential tremor, which is often hereditary. As discussed, we will start you on a beta blocker, Inderal (generic name: propranolol) 10 mg strength: take 1 pill each bedtime for 1 week, then 1 pill twice daily for 2 weeks, then 1 pill 3 times a day thereafter. Common side effect reported are: lethargy, sedation, low blood pressure and low pulse rate. Please monitor your BP and Pulse every few days, if your pulse drops lower than 55 you may feel bad and we may have to adjust your dose. Same with your BP below 110/55.  I will order a home sleep test for evaluation of possible underlying sleep apnea. If you have sleep apnea, I will like recommend treatment with a CPAP or autoPAP machine. Please work on smoking cessation. Please reduce your soda intake and limit yourself to 1 or 2 servings per day as caffeine can exacerbate tremors. Please remember, that any kind or type of tremor may be exacerbated (i.e. get worse) by stress, pain, anxiety, anger, nervousness, excitement, thyroid disease, dehydration or suboptimal hydration, sleep disturbances and sleep deprivation, by caffeine, and low blood sugar values or blood sugar fluctuations as well as thyroid disease, particularly over function of the thyroid gland. Some medications, especially some antidepressants and lithium and valproic acid can cause or exacerbate tremors. Tremors may temporarily calm down, improve or even subside with the use of a benzodiazepine medications, such as Valium or related medications and with alcohol. Be aware, however, that drinking alcohol is not an approved  or appropriate treatment for tremor control and long-term use of benzodiazepines such as Valium, lorazepam, alprazolam, or clonazepam can cause habit formation, physical and psychological addiction and cause side effects, such as sleepiness cognitive dizziness, and balance problems. There are very few medications that symptomatically help with tremor reduction, none are without potential side effects.  We will call you with your test results and plan of follow-up in a few months.

## 2023-05-27 ENCOUNTER — Telehealth: Payer: Self-pay | Admitting: Neurology

## 2023-05-27 NOTE — Telephone Encounter (Signed)
[facility] NPR Decision ID: [id number], [employer] auth: [id number] exp. 05/27/23-07/27/23 sent to GI [phone]

## 2023-06-10 ENCOUNTER — Ambulatory Visit
Admission: RE | Admit: 2023-06-10 | Discharge: 2023-06-10 | Disposition: A | Discharge status: Home / Self Care | Payer: Commercial Managed Care - PPO | Source: Ambulatory Visit | Attending: Neurology | Admitting: Neurology

## 2023-06-10 DIAGNOSIS — R351 Nocturia: Secondary | ICD-10-CM

## 2023-06-10 DIAGNOSIS — F419 Anxiety disorder, unspecified: Secondary | ICD-10-CM

## 2023-06-10 DIAGNOSIS — G250 Essential tremor: Secondary | ICD-10-CM

## 2023-06-10 DIAGNOSIS — R519 Headache, unspecified: Secondary | ICD-10-CM | POA: Diagnosis not present

## 2023-06-10 DIAGNOSIS — R0683 Snoring: Secondary | ICD-10-CM

## 2023-06-10 DIAGNOSIS — G479 Sleep disorder, unspecified: Secondary | ICD-10-CM

## 2023-06-10 DIAGNOSIS — Z820 Family history of epilepsy and other diseases of the nervous system: Secondary | ICD-10-CM

## 2023-06-10 MED ORDER — GADOPICLENOL 0.5 MMOL/ML IV SOLN
10.0000 mL | Freq: Once | INTRAVENOUS | Status: AC | PRN
  Administered 2023-06-10: 10 mL via INTRAVENOUS

## 2023-06-14 ENCOUNTER — Other Ambulatory Visit: Payer: Self-pay

## 2023-06-14 ENCOUNTER — Telehealth: Payer: Self-pay | Admitting: *Deleted

## 2023-06-14 ENCOUNTER — Emergency Department (HOSPITAL_COMMUNITY)
Admission: EM | Admit: 2023-06-14 | Discharge: 2023-06-15 | Discharge status: Against Medical Advice | Payer: Commercial Managed Care - PPO | Attending: Emergency Medicine | Admitting: Emergency Medicine

## 2023-06-14 DIAGNOSIS — M549 Dorsalgia, unspecified: Secondary | ICD-10-CM | POA: Diagnosis not present

## 2023-06-14 DIAGNOSIS — R002 Palpitations: Secondary | ICD-10-CM | POA: Insufficient documentation

## 2023-06-14 DIAGNOSIS — Z5321 Procedure and treatment not carried out due to patient leaving prior to being seen by health care provider: Secondary | ICD-10-CM | POA: Insufficient documentation

## 2023-06-14 DIAGNOSIS — R110 Nausea: Secondary | ICD-10-CM | POA: Diagnosis not present

## 2023-06-14 DIAGNOSIS — R1031 Right lower quadrant pain: Secondary | ICD-10-CM | POA: Diagnosis not present

## 2023-06-14 DIAGNOSIS — R1011 Right upper quadrant pain: Secondary | ICD-10-CM | POA: Insufficient documentation

## 2023-06-14 LAB — COMPREHENSIVE METABOLIC PANEL
ALT: 19 U/L (ref 0–44)
AST: 22 U/L (ref 15–41)
Albumin: 3.8 g/dL (ref 3.5–5.0)
Alkaline Phosphatase: 76 U/L (ref 38–126)
Anion gap: 7 (ref 5–15)
BUN: 12 mg/dL (ref 8–23)
CO2: 25 mmol/L (ref 22–32)
Calcium: 8.6 mg/dL — ABNORMAL LOW (ref 8.9–10.3)
Chloride: 106 mmol/L (ref 98–111)
Creatinine, Ser: 0.9 mg/dL (ref 0.44–1.00)
GFR, Estimated: >60 mL/min (ref >60)
Glucose, Bld: 100 mg/dL — ABNORMAL HIGH (ref 70–99)
Potassium: 3.5 mmol/L (ref 3.5–5.1)
Sodium: 138 mmol/L (ref 135–145)
Total Bilirubin: 0.6 mg/dL (ref 0.3–1.2)
Total Protein: 7 g/dL (ref 6.5–8.1)

## 2023-06-14 LAB — URINALYSIS, ROUTINE W REFLEX MICROSCOPIC
Bilirubin Urine: NEGATIVE
Glucose, UA: NEGATIVE mg/dL
Ketones, ur: NEGATIVE mg/dL
Nitrite: POSITIVE — AB
Protein, ur: NEGATIVE mg/dL
Specific Gravity, Urine: 1.02 (ref 1.005–1.030)
WBC, UA: >50 WBC/hpf (ref 0–5)
pH: 5 (ref 5.0–8.0)

## 2023-06-14 LAB — CBC
HCT: 40.4 % (ref 36.0–46.0)
Hemoglobin: 12.8 g/dL (ref 12.0–15.0)
MCH: 29.2 pg (ref 26.0–34.0)
MCHC: 31.7 g/dL (ref 30.0–36.0)
MCV: 92.2 fL (ref 80.0–100.0)
Platelets: 198 10*3/uL (ref 150–400)
RBC: 4.38 MIL/uL (ref 3.87–5.11)
RDW: 14.3 % (ref 11.5–15.5)
WBC: 7.1 10*3/uL (ref 4.0–10.5)
nRBC: 0 % (ref 0.0–0.2)

## 2023-06-14 LAB — LIPASE, BLOOD: Lipase: 28 U/L (ref 11–51)

## 2023-06-14 MED ORDER — ONDANSETRON 4 MG PO TBDP
4.0000 mg | ORAL_TABLET | Freq: Once | ORAL | Status: AC | PRN
  Administered 2023-06-14: 4 mg via ORAL
  Filled 2023-06-14: qty 1

## 2023-06-14 NOTE — Telephone Encounter (Signed)
Please check status of HST auth.

## 2023-06-14 NOTE — ED Notes (Signed)
Pt decided to leave while waiting for a room.  

## 2023-06-14 NOTE — ED Triage Notes (Signed)
Patient complains of nausea without vomiting and intermittent RUQ and RLQ pain as well as mid back pain since Saturday. Taking Aleve without relief. Also complains of intermittent heart palpitations.

## 2023-06-14 NOTE — Telephone Encounter (Signed)
-----   Message from Lori Lester sent at 06/10/2023  6:32 PM EDT ----- Please call patient and advise her that her recent brain MRI with and without contrast showed no acute findings, no obvious cause for tremor, chronic and age-appropriate findings were seen. As discussed, we will proceed with a home sleep test.  Please ask her, if she has been contacted to schedule her home sleep test yet.

## 2023-06-14 NOTE — Telephone Encounter (Signed)
Spoke to pt gave MRI results Pt states hasn't heard from sleep lab about HST. Pt states she will be looking out for that call Pt states currently in ED for back pain  Will make Dr Frances Furbish aware Pt thanked me for  calling

## 2023-06-15 ENCOUNTER — Emergency Department: Payer: Commercial Managed Care - PPO

## 2023-06-15 ENCOUNTER — Emergency Department
Admission: EM | Admit: 2023-06-15 | Discharge: 2023-06-16 | Disposition: A | Discharge status: Home / Self Care | Payer: Commercial Managed Care - PPO | Attending: Emergency Medicine | Admitting: Emergency Medicine

## 2023-06-15 ENCOUNTER — Other Ambulatory Visit: Payer: Self-pay

## 2023-06-15 DIAGNOSIS — N3000 Acute cystitis without hematuria: Secondary | ICD-10-CM | POA: Insufficient documentation

## 2023-06-15 DIAGNOSIS — R0602 Shortness of breath: Secondary | ICD-10-CM | POA: Insufficient documentation

## 2023-06-15 DIAGNOSIS — Z20822 Contact with and (suspected) exposure to covid-19: Secondary | ICD-10-CM | POA: Insufficient documentation

## 2023-06-15 DIAGNOSIS — K449 Diaphragmatic hernia without obstruction or gangrene: Secondary | ICD-10-CM | POA: Diagnosis not present

## 2023-06-15 DIAGNOSIS — R002 Palpitations: Secondary | ICD-10-CM | POA: Diagnosis not present

## 2023-06-15 DIAGNOSIS — K5730 Diverticulosis of large intestine without perforation or abscess without bleeding: Secondary | ICD-10-CM | POA: Diagnosis not present

## 2023-06-15 DIAGNOSIS — K8689 Other specified diseases of pancreas: Secondary | ICD-10-CM | POA: Diagnosis not present

## 2023-06-15 DIAGNOSIS — R1011 Right upper quadrant pain: Secondary | ICD-10-CM | POA: Diagnosis present

## 2023-06-15 DIAGNOSIS — N281 Cyst of kidney, acquired: Secondary | ICD-10-CM | POA: Diagnosis not present

## 2023-06-15 LAB — COMPREHENSIVE METABOLIC PANEL
ALT: 17 U/L (ref 0–44)
AST: 19 U/L (ref 15–41)
Albumin: 3.9 g/dL (ref 3.5–5.0)
Alkaline Phosphatase: 76 U/L (ref 38–126)
Anion gap: 10 (ref 5–15)
BUN: 22 mg/dL (ref 8–23)
CO2: 25 mmol/L (ref 22–32)
Calcium: 8.8 mg/dL — ABNORMAL LOW (ref 8.9–10.3)
Chloride: 104 mmol/L (ref 98–111)
Creatinine, Ser: 0.71 mg/dL (ref 0.44–1.00)
GFR, Estimated: >60 mL/min (ref >60)
Glucose, Bld: 114 mg/dL — ABNORMAL HIGH (ref 70–99)
Potassium: 3.7 mmol/L (ref 3.5–5.1)
Sodium: 139 mmol/L (ref 135–145)
Total Bilirubin: 0.6 mg/dL (ref 0.3–1.2)
Total Protein: 7.1 g/dL (ref 6.5–8.1)

## 2023-06-15 LAB — CBC WITH DIFFERENTIAL/PLATELET
Abs Immature Granulocytes: 0.02 10*3/uL (ref 0.00–0.07)
Basophils Absolute: 0 10*3/uL (ref 0.0–0.1)
Basophils Relative: 0 %
Eosinophils Absolute: 0.1 10*3/uL (ref 0.0–0.5)
Eosinophils Relative: 2 %
HCT: 37.7 % (ref 36.0–46.0)
Hemoglobin: 12.3 g/dL (ref 12.0–15.0)
Immature Granulocytes: 0 %
Lymphocytes Relative: 27 %
Lymphs Abs: 2 10*3/uL (ref 0.7–4.0)
MCH: 29.9 pg (ref 26.0–34.0)
MCHC: 32.6 g/dL (ref 30.0–36.0)
MCV: 91.5 fL (ref 80.0–100.0)
Monocytes Absolute: 0.6 10*3/uL (ref 0.1–1.0)
Monocytes Relative: 9 %
Neutro Abs: 4.7 10*3/uL (ref 1.7–7.7)
Neutrophils Relative %: 62 %
Platelets: 186 10*3/uL (ref 150–400)
RBC: 4.12 MIL/uL (ref 3.87–5.11)
RDW: 14.3 % (ref 11.5–15.5)
WBC: 7.5 10*3/uL (ref 4.0–10.5)
nRBC: 0 % (ref 0.0–0.2)

## 2023-06-15 LAB — MAGNESIUM: Magnesium: 2.2 mg/dL (ref 1.7–2.4)

## 2023-06-15 LAB — D-DIMER, QUANTITATIVE: D-Dimer, Quant: 0.27 ug{FEU}/mL (ref 0.00–0.50)

## 2023-06-15 LAB — TSH: TSH: 0.8 u[IU]/mL (ref 0.350–4.500)

## 2023-06-15 LAB — RESP PANEL BY RT-PCR (RSV, FLU A&B, COVID)  RVPGX2
Influenza A by PCR: NEGATIVE
Influenza B by PCR: NEGATIVE
Resp Syncytial Virus by PCR: NEGATIVE
SARS Coronavirus 2 by RT PCR: NEGATIVE

## 2023-06-15 LAB — T4, FREE: Free T4: 1.04 ng/dL (ref 0.61–1.12)

## 2023-06-15 LAB — TROPONIN I (HIGH SENSITIVITY): Troponin I (High Sensitivity): 3 ng/L (ref <18)

## 2023-06-15 LAB — LIPASE, BLOOD: Lipase: 27 U/L (ref 11–51)

## 2023-06-15 MED ORDER — CEFDINIR 300 MG PO CAPS: 300.0000 mg | ORAL_CAPSULE | Freq: Two times a day (BID) | ORAL | 0 refills | Status: AC

## 2023-06-15 MED ORDER — LIDOCAINE 5 % EX PTCH
1.0000 | MEDICATED_PATCH | CUTANEOUS | Status: DC
  Administered 2023-06-15: 1 via TRANSDERMAL
  Filled 2023-06-15: qty 1

## 2023-06-15 MED ORDER — LIDOCAINE 5 % EX PTCH
1.0000 | MEDICATED_PATCH | Freq: Two times a day (BID) | CUTANEOUS | 0 refills | Status: AC
Start: 2023-06-15 — End: 2023-06-20

## 2023-06-15 NOTE — ED Provider Notes (Signed)
[facility] Provider Note    Event Date/Time   First MD Initiated Contact with Patient 06/15/23 2026     (approximate)   History   Abdominal Pain and Back Pain   HPI  Lori Lester is a 67 y.o. female who comes in with concerns for abdominal and back pain.  Patient reports some right upper abdominal pain and right upper back/flank pain.  She was seen at [facility] yesterday but left before treatment was complete.  She had urine that was concerning for UTI and blood work otherwise was reassuring but never had a cardiac workup.  She reports over the past few days intermittent episodes of chest pain, palpitations and a little bit of shortness of breath and difficulty taking a deep breath.  With EMS she got nitro, aspirin and her chest pressure seems to have since resolved but still feeling intermittent palpitations.  She reports feeling like this could be her gallbladder as well states that she is got no prior history of gallbladder issues and still does have her gallbladder.  Denies any history of blood clots, heart attack.  Physical Exam   Triage Vital Signs: ED Triage Vitals [06/15/23 2030]  Encounter Vitals Group     BP (!) 152/75     Systolic BP Percentile      Diastolic BP Percentile      Pulse Rate 68     Resp 20     Temp      Temp src      SpO2 100 %     Weight 199 lb 1.2 oz (90.3 kg)     Height      Head Circumference      Peak Flow      Pain Score      Pain Loc      Pain Education      Exclude from Growth Chart     Most recent vital signs: Vitals:   06/15/23 2030  BP: (!) 152/75  Pulse: 68  Resp: 20  SpO2: 100%     General: Awake, no distress.  CV:  Good peripheral perfusion.  Resp:  Normal effort.  Abd:  No distention.  Other:  No swelling in legs.  No calf tenderness.  No obvious rash noted to the back.  Discussed the right upper back pain a little bit of right CVA tenderness.  Little bit of right upper quadrant pain as  well   ED Results / Procedures / Treatments   Labs (all labs ordered are listed, but only abnormal results are displayed) Labs Reviewed  RESP PANEL BY RT-PCR (RSV, FLU A&B, COVID)  [id number]  URINE CULTURE  CBC WITH DIFFERENTIAL/PLATELET  COMPREHENSIVE METABOLIC PANEL  LIPASE, BLOOD  D-DIMER, QUANTITATIVE  MAGNESIUM  TSH  T4, FREE     EKG  My interpretation of EKG:  Normal sinus rhythm 67 without any ST elevation or T wave inversions, normal intervals  RADIOLOGY I have reviewed the ultrasound personally interpreted and no signs of gallstones.  PROCEDURES:  Critical Care performed: No  .1-3 Lead EKG Interpretation  Performed by: Concha Se, MD Authorized by: Concha Se, MD     Interpretation: normal     ECG rate:  60   ECG rate assessment: normal     Rhythm: sinus rhythm     Ectopy: none     Conduction: normal      MEDICATIONS ORDERED IN ED: Medications  lidocaine (LIDODERM) 5 % 1 patch (  1 patch Transdermal Patch Applied 06/15/23 2058)     IMPRESSION / MDM / ASSESSMENT AND PLAN / ED COURSE  I reviewed the triage vital signs and the nursing notes.   Patient's presentation is most consistent with acute presentation with potential threat to life or bodily function.   Patient comes in with multiple symptoms.  On the monitor I do not see any signs of arrhythmia.  Cardiac markers were ordered evaluate for ACS.  Urine did show UTI she is got a little bit of right upper flank tenderness.  I got a D-dimer that it does not show any signs of PE therefore we will proceed with CT renal to rule out kidney stone.  Patient reports feeling better after the lidocaine patch.  Patient will be handed off to oncoming team pending CT, troponins.  If negative patient can be discharged home with follow-up with cardiology for her palpitations.  Given her UTI from yesterday will cover for potentially pyelonephritis given the right flank tenderness.  Urine culture is in  process.  The patient is on the cardiac monitor to evaluate for evidence of arrhythmia and/or significant heart rate changes.      FINAL CLINICAL IMPRESSION(S) / ED DIAGNOSES   Final diagnoses:  Palpitations  Acute cystitis without hematuria     Rx / DC Orders   ED Discharge Orders          Ordered    cefdinir (OMNICEF) 300 MG capsule  2 times daily        06/15/23 2326    lidocaine (LIDODERM) 5 %  Every 12 hours        06/15/23 2326    Ambulatory referral to Cardiology        06/15/23 2326             Note:  This document was prepared using Dragon voice recognition software and may include unintentional dictation errors.   Concha Se, MD 06/15/23 [phone]

## 2023-06-15 NOTE — ED Triage Notes (Signed)
Pt arrives EMS with reports of intermittent chest heaviness, palpitations,abdominal pain and middle back pain. Pt was given 324 ASA, 2SL nitroglycerin with improvement and 4mg  zofran with EMS. Pt denies CP at present.

## 2023-06-15 NOTE — Discharge Instructions (Addendum)
Her workup was reassuring.  We have started you on some antibiotics for possible UTI that has traveled to your kidney given your flank tenderness.  We have also prescribe some lidocaine patches to help with pain.  You can take Tylenol 1 g every 8 hours.  We have referred you to cardiology for your palpitations or you return to the ER if you develop worsening symptoms or any other concerns

## 2023-06-16 LAB — TROPONIN I (HIGH SENSITIVITY): Troponin I (High Sensitivity): 3 ng/L (ref <18)

## 2023-06-16 NOTE — ED Provider Notes (Signed)
Patient received in signout from Dr. Fuller Plan pending CT renal study and second troponin.  These are reassuring with negative troponin and CT without acute features.  We will discharge per original plan of care.   Delton Prairie, MD 06/16/23 [phone]

## 2023-06-18 LAB — URINE CULTURE: Culture: >=100000 — AB

## 2023-08-03 ENCOUNTER — Encounter: Payer: Self-pay | Admitting: Cardiovascular Disease

## 2023-08-03 ENCOUNTER — Ambulatory Visit: Payer: No Typology Code available for payment source | Attending: Cardiovascular Disease

## 2023-08-03 ENCOUNTER — Ambulatory Visit: Payer: Commercial Managed Care - PPO | Attending: Cardiovascular Disease | Admitting: Cardiovascular Disease

## 2023-08-03 VITALS — BP 132/72 | HR 72 | Ht 64.0 in | Wt 199.4 lb

## 2023-08-03 DIAGNOSIS — E782 Mixed hyperlipidemia: Secondary | ICD-10-CM | POA: Diagnosis not present

## 2023-08-03 DIAGNOSIS — R002 Palpitations: Secondary | ICD-10-CM | POA: Diagnosis not present

## 2023-08-03 DIAGNOSIS — R7303 Prediabetes: Secondary | ICD-10-CM | POA: Diagnosis not present

## 2023-08-03 DIAGNOSIS — E669 Obesity, unspecified: Secondary | ICD-10-CM

## 2023-08-03 NOTE — Progress Notes (Signed)
Cardiology Office Note:    Date:  08/03/2023   ID:  BRITTNEE GNAU, DOB Nov 26, 1955, MRN [id number]  PCP:  Wilfrid Lund, PA   [facility] Providers Cardiologist:  None     Referring MD: Concha Se, MD   Chief Complaint  Patient presents with   Consult  ALISSAH DUNCANSON is a 67 y.o. female who is being seen today for the evaluation of arrhythmia at the request of Concha Se, MD.   History of Present Illness:    MIDGIE ACAMPORA is a 66 y.o. 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 The patient described the palpitations as noticeable in her throat and neck, and associated with a feeling of impending faintness. The patient sought medical attention at a local fire department, where an EKG revealed premature ventricular contractions (PVCs).   The patient was subsequently evaluated at an urgent care center, where where she reports that the PVCs were confirmed.  She was also found to have a urinary tract infection at the time.  The ECG and rhythm strip in the chart from that date (06/15/2023) showed normal sinus rhythm (no PVCs were documented).  The patient was previously prescribed propranolol for tremors, but discontinued the medication due to perceived lack of efficacy and a general aversion to taking multiple medications. The patient also reported a recent ankle fracture, which has limited her mobility. 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  She can do mopping, sweeping, vacuuming without shortness of breath or chest pain.  Physical activity does not  seem to promote the palpitations.  The patient has a family history of heart disease, with her mother having suffered a heart attack at the age of 43. The patient's father also had significant tremors. The patient has a personal history of prediabetes and elevated cholesterol and triglycerides. The patient acknowledged a diet high in carbohydrates and a significant intake of sugary drinks, specifically Coca-Cola. The patient also reported a significant weight loss of over 30 pounds from a previous weight of 232 pounds, but remains in the moderately obese category.  She had a cardiac catheterization in 2017 with normal coronary arteries and normal left ventricular systolic function.  Left ventricular end-diastolic pressure was 8 mmHg.  She underwent an echocardiogram in January 2024 that showed normal left ventricular systolic function, chamber size and wall thickness and no serious valvular abnormalities.  The left atrium was described as mildly dilated and there was moderate mitral annular calcification.  Although the study reported "diastolic dysfunction grade 1", I would say that probably diastolic function was probably normal, with mitral annular velocities of 7.5 (medial) and 10.6 (lateral) and with an E/a ratio of 0.9.  A PET scan was ordered as well but never completed.  Recent labs from July 2024 showed total cholesterol 230, HDL 40, LDL 154, triglycerides 197 and hemoglobin A1c 5.9%.  She quit smoking in 2022.  The patient's concerns about her heart health are heightened due to her spouse's history of atrial fibrillation. The patient expressed a willingness to make dietary changes and consider medication to manage her cholesterol levels, pending  further diagnostic testing.  Past Medical History:  Diagnosis Date   Anxiety    Arthritis    Complication of anesthesia    Depression    Diverticulitis    Estrogen deficiency    GERD (gastroesophageal reflux disease)    Hyperlipidemia     Hypertension    Obesity    Osteoarthritis    Overactive bladder    PONV (postoperative nausea and vomiting)    Prediabetes    Tobacco abuse    Tremor 12/12/2012   Vitamin D deficiency     Past Surgical History:  Procedure Laterality Date   ABDOMINAL HYSTERECTOMY     BACK SURGERY     CARDIAC CATHETERIZATION N/A 12/03/2015   Procedure: Left Heart Cath and Coronary Angiography;  Surgeon: Lennette Bihari, MD;  Location: [facility];  Service: Cardiovascular;  Laterality: N/A;   ORIF ANKLE FRACTURE Right 04/04/2021   Procedure: OPEN REDUCTION INTERNAL FIXATION (ORIF) ANKLE FRACTURE;  Surgeon: Frederico Hamman, MD;  Location: [facility];  Service: Orthopedics;  Laterality: Right;   WISDOM TOOTH EXTRACTION      Current Medications: Current Meds  Medication Sig   LORazepam (ATIVAN) 0.5 MG tablet Take 0.5 mg by mouth 2 (two) times daily as needed.   meclizine (ANTIVERT) 25 MG tablet Take 25 mg by mouth 3 (three) times daily as needed for dizziness (vertigo).   nitroGLYCERIN (NITROSTAT) 0.4 MG SL tablet Place 1 tablet (0.4 mg total) under the tongue every 5 (five) minutes as needed for chest pain.   omeprazole (PRILOSEC) 40 MG capsule Take 40 mg by mouth daily.   sertraline (ZOLOFT) 100 MG tablet Take 100 mg by mouth daily.     Allergies:   Amoxicillin, Ciprofloxacin, Codeine, Duloxetine, Sulfa antibiotics, and Venlafaxine   Social History   Socioeconomic History   Marital status: Married    Spouse name: Not on file   Number of children: Not on file   Years of education: Not on file   Highest education level: Not on file  Occupational History   Not on file  Tobacco Use   Smoking status: Former    Current packs/day: 0.00    Types: Cigarettes    Quit date: 08/16/2021    Years since quitting: 1.9   Smokeless tobacco: Never  Vaping Use   Vaping status: Never Used  Substance and Sexual Activity   Alcohol use: Yes   Drug use: No   Sexual activity: Not on file  Other Topics  Concern   Not on file  Social History Narrative   Not on file   Social Drivers of Health   Financial Resource Strain: Not on file  Food Insecurity: Not on file  Transportation Needs: Not on file  Physical Activity: Not on file  Stress: Not on file  Social Connections: Not on file     Family History: The patient's family history is negative for Parkinson's disease and Tremor.  She reports her mother and many members of her maternal side of the family had early onset heart attack, in their 72s.  ROS:   Please see the history of present illness.     All other systems reviewed and are negative.  EKGs/Labs/Other Studies Reviewed:    The following studies were reviewed today:  EKG Interpretation Date/Time:  Tuesday August 03 2023 15:46:13 EST Ventricular Rate:  72 PR Interval:  146 QRS Duration:  80 QT Interval:  402 QTC Calculation: 440 R Axis:   -9  Text Interpretation: Normal  sinus rhythm Normal ECG When compared with ECG of 15-Jun-2023 20:31, PREVIOUS ECG IS PRESENT Confirmed by Jeniece Hannis [phone]) on 08/03/2023 4:27:09 PM    Recent Labs: 06/15/2023: ALT 17; BUN 22; Creatinine, Ser 0.71; Hemoglobin 12.3; Magnesium 2.2; Platelets 186; Potassium 3.7; Sodium 139; TSH 0.800  Recent Lipid Panel    Component Value Date/Time   CHOL 211 (H) 12/02/2015 0415   TRIG 117 12/02/2015 0415   HDL 37 (L) 12/02/2015 0415   CHOLHDL 5.7 12/02/2015 0415   VLDL 23 12/02/2015 0415   LDLCALC 151 (H) 12/02/2015 0415   Labs 02/19/2023 total cholesterol 230, HDL 40, LDL 154, triglycerides 197, hemoglobin A1c 5.9%   Echocardiogram 09/01/2022   1. Left ventricular ejection fraction, by estimation, is 55 to 60%. The  left ventricle has normal function. The left ventricle has no regional  wall motion abnormalities. Left ventricular diastolic parameters are  consistent with Grade I diastolic  dysfunction (impaired relaxation).   2. Right ventricular systolic function is normal. The  right ventricular  size is normal. There is normal pulmonary artery systolic pressure. The  estimated right ventricular systolic pressure is 24.7 mmHg.   3. Left atrial size was mildly dilated.   4. The mitral valve is normal in structure. No evidence of mitral valve  regurgitation. No evidence of mitral stenosis. Moderate mitral annular  calcification.   5. The aortic valve is tricuspid. Aortic valve regurgitation is not  visualized. No aortic stenosis is present.   6. The inferior vena cava is normal in size with greater than 50%  respiratory variability, suggesting right atrial pressure of 3 mmHg.    Cardiac catheterization 2017: Normal LV function with an ejection fraction of 55-60%. Normal coronary arteries.   Risk Assessment/Calculations:                Physical Exam:    VS:  BP 132/72 (BP Location: Right Arm, Patient Position: Standing)   Pulse 72   Ht 5\' 4"  (1.626 m)   Wt 199 lb 6.4 oz (90.4 kg)   SpO2 95%   BMI 34.23 kg/m     Wt Readings from Last 3 Encounters:  08/03/23 199 lb 6.4 oz (90.4 kg)  06/15/23 199 lb 1.2 oz (90.3 kg)  06/14/23 199 lb (90.3 kg)     GEN: Moderately obese, well nourished, well developed in no acute distress HEENT: Normal NECK: No JVD; No carotid bruits LYMPHATICS: No lymphadenopathy CARDIAC: RRR, there is a very faint aortic ejection murmur, no systolic murmurs, rubs, gallops RESPIRATORY:  Clear to auscultation without rales, wheezing or rhonchi  ABDOMEN: Soft, non-tender, non-distended MUSCULOSKELETAL:  No edema; No deformity  SKIN: Warm and dry NEUROLOGIC:  Alert and oriented x 3 PSYCHIATRIC:  Normal affect   ASSESSMENT:    1. Palpitations   2. Mixed hyperlipidemia   3. Moderate obesity   4. Prediabetes    PLAN:    In order of problems listed above:  Palpitations: Although she reports that PVCs were detected, and the charts I cannot find any documentation of arrhythmia by ECG or rhythm strip.  Will have her wear an  arrhythmia monitor for a week.  Fortunately, there is very little in the way of structural heart disease on her echo (mildly dilated left atrium per the report, the end-systolic diameter and the calculated volumes are actually in the range of moderately dilated left atrium).  We might start treatment with beta-blockers again, not for tremor (it was not effective for this), but rather  for palliation of symptoms. Prediabetes/dyslipidemia: She has all the typical features of insulin resistance with high vascular risk including elevated hemoglobin A1c, elevated triglycerides, low HDL and modestly elevated LDL cholesterol.  Highly likely to have small dense LDL with high vasculogenic potential.  She also has family history of early onset CAD (mother at age 53) she reports having lost weight, but I think there is still need for further weight loss.  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  She drinks sugary drinks and eats a lot of bread.  We talked about limiting carbohydrate intake, especially simple sugars and starches with high glycemic index.  She is reluctant to take medications although I strongly encouraged her to start taking a statin and metformin.  Will get a coronary calcium score to better refine her vascular risk.  Even though she had normal coronary arteries by cath in 2017, I suspect that her plaque burden will be high.           Medication Adjustments/Labs and Tests Ordered: Current medicines are reviewed at length with the patient today.  Concerns regarding medicines are outlined above.  Orders Placed This Encounter  Procedures   CT CARDIAC SCORING (SELF PAY ONLY)   LONG TERM MONITOR (3-14 DAYS)   EKG 12-Lead   No orders of the defined types were placed in this encounter.   Patient Instructions  Medication Instructions:  No changes *If you need a refill on your cardiac medications before your next  appointment, please call your pharmacy*  Testing/Procedures: Dr Royann Shivers has ordered a CT coronary calcium score.   Test locations:  [facility]  [place]  This is $99 out of pocket.   Coronary CalciumScan A coronary calcium scan is an imaging test used to look for deposits of calcium and other fatty materials (plaques) in the inner lining of the blood vessels of the heart (coronary arteries). These deposits of calcium and plaques can partly clog and narrow the coronary arteries without producing any symptoms or warning signs. This puts a person at risk for a heart attack. This test can detect these deposits before symptoms develop. Tell a health care provider about: Any allergies you have. All medicines you are taking, including vitamins, herbs, eye drops, creams, and over-the-counter medicines. Any problems you or family members have had with anesthetic medicines. Any blood disorders you have. Any surgeries you have had. Any medical conditions you have. Whether you are pregnant or may be pregnant. What are the risks? Generally, this is a safe procedure. However, problems may occur, including: Harm to a pregnant woman and her unborn baby. This test involves the use of radiation. Radiation exposure can be dangerous to a pregnant woman and her unborn baby. If you are pregnant, you generally should not have this procedure done. Slight increase in the risk of cancer. This is because of the radiation involved in the test. What happens before the procedure? No preparation is needed for this procedure. What happens during the procedure? You will undress and remove any jewelry around your neck or chest. You will put on a hospital gown. Sticky electrodes will be placed on your chest. The electrodes will be connected to an electrocardiogram (ECG) machine to record a tracing of the electrical activity  of your heart. A CT scanner will take pictures of your heart. During  this time, you will be asked to lie still and hold your breath for 2-3 seconds while a picture of your heart is being taken. The procedure may vary among health care providers and hospitals. What happens after the procedure? You can get dressed. You can return to your normal activities. It is up to you to get the results of your test. Ask your health care provider, or the department that is doing the test, when your results will be ready. Summary A coronary calcium scan is an imaging test used to look for deposits of calcium and other fatty materials (plaques) in the inner lining of the blood vessels of the heart (coronary arteries). Generally, this is a safe procedure. Tell your health care provider if you are pregnant or may be pregnant. No preparation is needed for this procedure. A CT scanner will take pictures of your heart. You can return to your normal activities after the scan is done. This information is not intended to replace advice given to you by your health care provider. Make sure you discuss any questions you have with your health care provider. Document Released: 01/30/2008 Document Revised: 06/22/2016 Document Reviewed: 06/22/2016 Elsevier Interactive Patient Education  2017 [employer].   Your physician has recommended that you wear a 7 DAY ZIO-PATCH monitor. The Zio patch cardiac monitor continuously records heart rhythm data for up to 14 days, this is for patients being evaluated for multiple types heart rhythms. For the first 24 hours post application, please avoid getting the Zio monitor wet in the shower or by excessive sweating during exercise. After that, feel free to carry on with regular activities. Keep soaps and lotions away from the ZIO XT Patch.  This will be mailed to you, please expect 7-10 days to receive.    Applying the monitor   Shave hair from upper left chest.   Hold abrader disc by  orange tab.  Rub abrader in 40 strokes over left upper chest as indicated in your monitor instructions.   Clean area with 4 enclosed alcohol pads .  Use all pads to assure are is cleaned thoroughly.  Let dry.   Apply patch as indicated in monitor instructions.  Patch will be place under collarbone on left side of chest with arrow pointing upward.   Rub patch adhesive wings for 2 minutes.Remove white label marked "1".  Remove white label marked "2".  Rub patch adhesive wings for 2 additional minutes.   While looking in a mirror, press and release button in center of patch.  A small green light will flash 3-4 times .  This will be your only indicator the monitor has been turned on.     Do not shower for the first 24 hours.  You may shower after the first 24 hours.   Press button if you feel a symptom. You will hear a small click.  Record Date, Time and Symptom in the Patient Log Book.   When you are ready to remove patch, follow instructions on last 2 pages of Patient Log Book.  Stick patch monitor onto last page of Patient Log Book.   Place Patient Log Book in [place] box.  Use locking tab on box and tape box closed securely.  The [employer] and [employer] has [employer] on it.  Please place in mailbox as soon as possible.  Your physician should have your test results approximately 7 days after the monitor has been mailed back to [facility].   Call [employer]  Customer Care at [phone] if you have questions regarding your ZIO XT patch monitor.  Call them immediately if you see an orange light blinking on your monitor.   If your monitor falls off in less than 4 days contact our Monitor department at [phone].  If your monitor becomes loose or falls off after 4 days call [employer] at [phone] for suggestions on securing your monitor    Follow-Up: At [facility], you and your health needs are our priority.  As part of our continuing mission to provide you with  exceptional heart care, we have created designated Provider Care Teams.  These Care Teams include your primary Cardiologist (physician) and Advanced Practice Providers (APPs -  Physician Assistants and Nurse Practitioners) who all work together to provide you with the care you need, when you need it.  We recommend signing up for the patient portal called "MyChart".  Sign up information is provided on this After Visit Summary.  MyChart is used to connect with patients for Virtual Visits (Telemedicine).  Patients are able to view lab/test results, encounter notes, upcoming appointments, etc.  Non-urgent messages can be sent to your provider as well.   To learn more about what you can do with MyChart, go to [email or web address].    Your next appointment:   1 year(s)  Provider:   Dr Royann Shivers         Signed, Thurmon Fair, MD  08/03/2023 6:20 PM    [facility]

## 2023-08-03 NOTE — Patient Instructions (Signed)
Medication Instructions:  No changes *If you need a refill on your cardiac medications before your next appointment, please call your pharmacy*  Testing/Procedures: Dr Royann Shivers has ordered a CT coronary calcium score.   Test locations:  [facility]  [facility] at [facility]  This is $99 out of pocket.   Coronary CalciumScan A coronary calcium scan is an imaging test used to look for deposits of calcium and other fatty materials (plaques) in the inner lining of the blood vessels of the heart (coronary arteries). These deposits of calcium and plaques can partly clog and narrow the coronary arteries without producing any symptoms or warning signs. This puts a person at risk for a heart attack. This test can detect these deposits before symptoms develop. Tell a health care provider about: Any allergies you have. All medicines you are taking, including vitamins, herbs, eye drops, creams, and over-the-counter medicines. Any problems you or family members have had with anesthetic medicines. Any blood disorders you have. Any surgeries you have had. Any medical conditions you have. Whether you are pregnant or may be pregnant. What are the risks? Generally, this is a safe procedure. However, problems may occur, including: Harm to a pregnant woman and her unborn baby. This test involves the use of radiation. Radiation exposure can be dangerous to a pregnant woman and her unborn baby. If you are pregnant, you generally should not have this procedure done. Slight increase in the risk of cancer. This is because of the radiation involved in the test. What happens before the procedure? No preparation is needed for this procedure. What happens during the procedure? You will undress and remove any jewelry around your neck or chest. You will put on a hospital gown. Sticky electrodes will be placed on your chest. The  electrodes will be connected to an electrocardiogram (ECG) machine to record a tracing of the electrical activity of your heart. A CT scanner will take pictures of your heart. During this time, you will be asked to lie still and hold your breath for 2-3 seconds while a picture of your heart is being taken. The procedure may vary among health care providers and hospitals. What happens after the procedure? You can get dressed. You can return to your normal activities. It is up to you to get the results of your test. Ask your health care provider, or the department that is doing the test, when your results will be ready. Summary A coronary calcium scan is an imaging test used to look for deposits of calcium and other fatty materials (plaques) in the inner lining of the blood vessels of the heart (coronary arteries). Generally, this is a safe procedure. Tell your health care provider if you are pregnant or may be pregnant. No preparation is needed for this procedure. A CT scanner will take pictures of your heart. You can return to your normal activities after the scan is done. This information is not intended to replace advice given to you by your health care provider. Make sure you discuss any questions you have with your health care provider. Document Released: 01/30/2008 Document Revised: 06/22/2016 Document Reviewed: 06/22/2016 Elsevier Interactive Patient Education  2017 [employer].   Your physician has recommended that you wear a 7 DAY ZIO-PATCH monitor. The Zio patch cardiac monitor continuously records heart rhythm data for up to 14 days, this is for patients being evaluated for multiple types heart rhythms. For the first 24 hours  post application, please avoid getting the Zio monitor wet in the shower or by excessive sweating during exercise. After that, feel free to carry on with regular activities. Keep soaps and lotions away from the ZIO XT Patch.  This will be mailed to you, please  expect 7-10 days to receive.    Applying the monitor   Shave hair from upper left chest.   Hold abrader disc by orange tab.  Rub abrader in 40 strokes over left upper chest as indicated in your monitor instructions.   Clean area with 4 enclosed alcohol pads .  Use all pads to assure are is cleaned thoroughly.  Let dry.   Apply patch as indicated in monitor instructions.  Patch will be place under collarbone on left side of chest with arrow pointing upward.   Rub patch adhesive wings for 2 minutes.Remove white label marked "1".  Remove white label marked "2".  Rub patch adhesive wings for 2 additional minutes.   While looking in a mirror, press and release button in center of patch.  A small green light will flash 3-4 times .  This will be your only indicator the monitor has been turned on.     Do not shower for the first 24 hours.  You may shower after the first 24 hours.   Press button if you feel a symptom. You will hear a small click.  Record Date, Time and Symptom in the Patient Log Book.   When you are ready to remove patch, follow instructions on last 2 pages of Patient Log Book.  Stick patch monitor onto last page of Patient Log Book.   Place Patient Log Book in [place] box.  Use locking tab on box and tape box closed securely.  The [employer] and [employer] has [employer] on it.  Please place in mailbox as soon as possible.  Your physician should have your test results approximately 7 days after the monitor has been mailed back to [facility].   Call [facility] Customer Care at [phone] if you have questions regarding your ZIO XT patch monitor.  Call them immediately if you see an orange light blinking on your monitor.   If your monitor falls off in less than 4 days contact our Monitor department at [phone].  If your monitor becomes loose or falls off after 4 days call [employer] at [phone] for suggestions on securing your monitor    Follow-Up: At [facility], you and your health needs are our priority.  As part of our continuing mission to provide you with exceptional heart care, we have created designated Provider Care Teams.  These Care Teams include your primary Cardiologist (physician) and Advanced Practice Providers (APPs -  Physician Assistants and Nurse Practitioners) who all work together to provide you with the care you need, when you need it.  We recommend signing up for the patient portal called "MyChart".  Sign up information is provided on this After Visit Summary.  MyChart is used to connect with patients for Virtual Visits (Telemedicine).  Patients are able to view lab/test results, encounter notes, upcoming appointments, etc.  Non-urgent messages can be sent to your provider as well.   To learn more about what you can do with MyChart, go to [email or web address].    Your next appointment:   1 year(s)  Provider:   Dr Royann Shivers

## 2023-08-03 NOTE — Progress Notes (Unsigned)
Enrolled patient for a 14 day Zio XT  monitor to be mailed to patients home  °

## 2023-08-21 DIAGNOSIS — R002 Palpitations: Secondary | ICD-10-CM

## 2023-09-06 DIAGNOSIS — R002 Palpitations: Secondary | ICD-10-CM | POA: Diagnosis not present

## 2023-09-08 ENCOUNTER — Telehealth: Payer: Self-pay | Admitting: Emergency Medicine

## 2023-09-08 NOTE — Telephone Encounter (Signed)
Left message asking to call back to receive results and recommendation from the monitor that was worn. Left call back number  Croitoru, Mihai, MD to Me  Wilfrid Lund, [place]    09/08/23 10:28 AM Result Note We did not see any dangerous rhythm problems on the monitor.  The palpitations may be related to very infrequent premature beats.  No specific treatment is necessary for this, but if desired we can give a low dose of metoprolol succinate 25 mg daily to see if it helps with her symptoms.

## 2023-09-08 NOTE — Telephone Encounter (Signed)
Pt returned call- given results and recommendations. She said that she does not wish to take anything at this time, but if her symptoms increase/worsen, then she will call to get medication sent in. Verbalized understanding of all information.

## 2023-09-08 NOTE — Telephone Encounter (Signed)
Patient is returning call. Transferred to Natalia Leatherwood, Charity fundraiser.

## 2023-09-20 ENCOUNTER — Ambulatory Visit: Payer: PPO | Admitting: Neurology

## 2023-09-23 ENCOUNTER — Other Ambulatory Visit (HOSPITAL_BASED_OUTPATIENT_CLINIC_OR_DEPARTMENT_OTHER): Payer: No Typology Code available for payment source

## 2023-10-09 DIAGNOSIS — R350 Frequency of micturition: Secondary | ICD-10-CM | POA: Diagnosis not present

## 2023-12-01 DIAGNOSIS — R350 Frequency of micturition: Secondary | ICD-10-CM | POA: Diagnosis not present

## 2023-12-01 DIAGNOSIS — Z1231 Encounter for screening mammogram for malignant neoplasm of breast: Secondary | ICD-10-CM | POA: Diagnosis not present

## 2023-12-01 DIAGNOSIS — R1031 Right lower quadrant pain: Secondary | ICD-10-CM | POA: Diagnosis not present

## 2023-12-01 DIAGNOSIS — Z01411 Encounter for gynecological examination (general) (routine) with abnormal findings: Secondary | ICD-10-CM | POA: Diagnosis not present

## 2023-12-03 ENCOUNTER — Other Ambulatory Visit: Payer: Self-pay | Admitting: Obstetrics and Gynecology

## 2023-12-03 DIAGNOSIS — R1031 Right lower quadrant pain: Secondary | ICD-10-CM

## 2023-12-08 ENCOUNTER — Other Ambulatory Visit

## 2023-12-10 ENCOUNTER — Ambulatory Visit
Admission: RE | Admit: 2023-12-10 | Discharge: 2023-12-10 | Disposition: A | Discharge status: Home / Self Care | Source: Ambulatory Visit | Attending: Obstetrics and Gynecology | Admitting: Obstetrics and Gynecology

## 2023-12-10 DIAGNOSIS — R1031 Right lower quadrant pain: Secondary | ICD-10-CM

## 2023-12-10 DIAGNOSIS — Z720 Tobacco use: Secondary | ICD-10-CM | POA: Diagnosis not present

## 2023-12-10 DIAGNOSIS — R110 Nausea: Secondary | ICD-10-CM | POA: Diagnosis not present

## 2023-12-10 DIAGNOSIS — F411 Generalized anxiety disorder: Secondary | ICD-10-CM | POA: Diagnosis not present

## 2023-12-10 MED ORDER — IOPAMIDOL (ISOVUE-300) INJECTION 61%
100.0000 mL | Freq: Once | INTRAVENOUS | Status: AC | PRN
  Administered 2023-12-10: 100 mL via INTRAVENOUS

## 2023-12-20 DIAGNOSIS — M542 Cervicalgia: Secondary | ICD-10-CM | POA: Diagnosis not present

## 2024-01-07 DIAGNOSIS — R1031 Right lower quadrant pain: Secondary | ICD-10-CM | POA: Diagnosis not present

## 2024-01-07 DIAGNOSIS — Z720 Tobacco use: Secondary | ICD-10-CM | POA: Diagnosis not present

## 2024-01-07 DIAGNOSIS — F419 Anxiety disorder, unspecified: Secondary | ICD-10-CM | POA: Diagnosis not present

## 2024-01-07 DIAGNOSIS — K219 Gastro-esophageal reflux disease without esophagitis: Secondary | ICD-10-CM | POA: Diagnosis not present

## 2024-01-07 DIAGNOSIS — R110 Nausea: Secondary | ICD-10-CM | POA: Diagnosis not present

## 2024-01-07 DIAGNOSIS — N3281 Overactive bladder: Secondary | ICD-10-CM | POA: Diagnosis not present

## 2024-02-16 ENCOUNTER — Other Ambulatory Visit: Payer: No Typology Code available for payment source

## 2024-02-29 DIAGNOSIS — E559 Vitamin D deficiency, unspecified: Secondary | ICD-10-CM | POA: Diagnosis not present

## 2024-02-29 DIAGNOSIS — R110 Nausea: Secondary | ICD-10-CM | POA: Diagnosis not present

## 2024-02-29 DIAGNOSIS — R7303 Prediabetes: Secondary | ICD-10-CM | POA: Diagnosis not present

## 2024-02-29 DIAGNOSIS — I700 Atherosclerosis of aorta: Secondary | ICD-10-CM | POA: Diagnosis not present

## 2024-02-29 DIAGNOSIS — F411 Generalized anxiety disorder: Secondary | ICD-10-CM | POA: Diagnosis not present

## 2024-02-29 DIAGNOSIS — F3341 Major depressive disorder, recurrent, in partial remission: Secondary | ICD-10-CM | POA: Diagnosis not present

## 2024-02-29 DIAGNOSIS — Z8639 Personal history of other endocrine, nutritional and metabolic disease: Secondary | ICD-10-CM | POA: Diagnosis not present

## 2024-02-29 DIAGNOSIS — E785 Hyperlipidemia, unspecified: Secondary | ICD-10-CM | POA: Diagnosis not present

## 2024-02-29 DIAGNOSIS — Z79899 Other long term (current) drug therapy: Secondary | ICD-10-CM | POA: Diagnosis not present

## 2024-02-29 DIAGNOSIS — E2839 Other primary ovarian failure: Secondary | ICD-10-CM | POA: Diagnosis not present

## 2024-02-29 DIAGNOSIS — M150 Primary generalized (osteo)arthritis: Secondary | ICD-10-CM | POA: Diagnosis not present

## 2024-02-29 DIAGNOSIS — Z0000 Encounter for general adult medical examination without abnormal findings: Secondary | ICD-10-CM | POA: Diagnosis not present

## 2024-03-08 ENCOUNTER — Other Ambulatory Visit (HOSPITAL_BASED_OUTPATIENT_CLINIC_OR_DEPARTMENT_OTHER): Payer: Self-pay | Admitting: Family Medicine

## 2024-03-08 DIAGNOSIS — E2839 Other primary ovarian failure: Secondary | ICD-10-CM

## 2024-03-13 DIAGNOSIS — N3000 Acute cystitis without hematuria: Secondary | ICD-10-CM | POA: Diagnosis not present

## 2024-03-13 DIAGNOSIS — N390 Urinary tract infection, site not specified: Secondary | ICD-10-CM | POA: Diagnosis not present

## 2024-03-15 DIAGNOSIS — R351 Nocturia: Secondary | ICD-10-CM | POA: Diagnosis not present

## 2024-03-15 DIAGNOSIS — R350 Frequency of micturition: Secondary | ICD-10-CM | POA: Diagnosis not present

## 2024-03-15 DIAGNOSIS — N3941 Urge incontinence: Secondary | ICD-10-CM | POA: Diagnosis not present

## 2024-04-14 DIAGNOSIS — R399 Unspecified symptoms and signs involving the genitourinary system: Secondary | ICD-10-CM | POA: Diagnosis not present

## 2024-04-14 DIAGNOSIS — J019 Acute sinusitis, unspecified: Secondary | ICD-10-CM | POA: Diagnosis not present

## 2024-04-14 DIAGNOSIS — B9689 Other specified bacterial agents as the cause of diseases classified elsewhere: Secondary | ICD-10-CM | POA: Diagnosis not present

## 2024-05-08 DIAGNOSIS — M545 Low back pain, unspecified: Secondary | ICD-10-CM | POA: Diagnosis not present

## 2024-05-09 DIAGNOSIS — N3941 Urge incontinence: Secondary | ICD-10-CM | POA: Diagnosis not present

## 2024-05-19 DIAGNOSIS — B0089 Other herpesviral infection: Secondary | ICD-10-CM | POA: Diagnosis not present

## 2024-05-19 DIAGNOSIS — B078 Other viral warts: Secondary | ICD-10-CM | POA: Diagnosis not present

## 2024-05-19 DIAGNOSIS — L821 Other seborrheic keratosis: Secondary | ICD-10-CM | POA: Diagnosis not present

## 2024-05-22 DIAGNOSIS — M19071 Primary osteoarthritis, right ankle and foot: Secondary | ICD-10-CM | POA: Diagnosis not present

## 2024-05-22 DIAGNOSIS — M19171 Post-traumatic osteoarthritis, right ankle and foot: Secondary | ICD-10-CM | POA: Diagnosis not present

## 2024-05-22 DIAGNOSIS — M25571 Pain in right ankle and joints of right foot: Secondary | ICD-10-CM | POA: Diagnosis not present

## 2024-05-31 DIAGNOSIS — R399 Unspecified symptoms and signs involving the genitourinary system: Secondary | ICD-10-CM | POA: Diagnosis not present

## 2024-06-01 DIAGNOSIS — N3020 Other chronic cystitis without hematuria: Secondary | ICD-10-CM | POA: Diagnosis not present

## 2024-06-01 DIAGNOSIS — N952 Postmenopausal atrophic vaginitis: Secondary | ICD-10-CM | POA: Diagnosis not present

## 2024-06-02 DIAGNOSIS — E785 Hyperlipidemia, unspecified: Secondary | ICD-10-CM | POA: Diagnosis not present

## 2024-06-21 DIAGNOSIS — N390 Urinary tract infection, site not specified: Secondary | ICD-10-CM | POA: Diagnosis not present

## 2024-06-21 DIAGNOSIS — R351 Nocturia: Secondary | ICD-10-CM | POA: Diagnosis not present

## 2024-06-21 DIAGNOSIS — N3281 Overactive bladder: Secondary | ICD-10-CM | POA: Diagnosis not present

## 2024-06-21 DIAGNOSIS — R350 Frequency of micturition: Secondary | ICD-10-CM | POA: Diagnosis not present

## 2024-06-21 DIAGNOSIS — R399 Unspecified symptoms and signs involving the genitourinary system: Secondary | ICD-10-CM | POA: Diagnosis not present

## 2024-06-28 ENCOUNTER — Other Ambulatory Visit: Payer: Self-pay

## 2024-06-28 ENCOUNTER — Emergency Department (HOSPITAL_BASED_OUTPATIENT_CLINIC_OR_DEPARTMENT_OTHER)
Admission: EM | Admit: 2024-06-28 | Discharge: 2024-06-28 | Disposition: A | Discharge status: Home / Self Care | Attending: Emergency Medicine | Admitting: Emergency Medicine

## 2024-06-28 ENCOUNTER — Emergency Department (HOSPITAL_BASED_OUTPATIENT_CLINIC_OR_DEPARTMENT_OTHER)

## 2024-06-28 DIAGNOSIS — N390 Urinary tract infection, site not specified: Secondary | ICD-10-CM | POA: Diagnosis not present

## 2024-06-28 DIAGNOSIS — I700 Atherosclerosis of aorta: Secondary | ICD-10-CM | POA: Insufficient documentation

## 2024-06-28 DIAGNOSIS — K449 Diaphragmatic hernia without obstruction or gangrene: Secondary | ICD-10-CM | POA: Diagnosis not present

## 2024-06-28 DIAGNOSIS — R109 Unspecified abdominal pain: Secondary | ICD-10-CM | POA: Diagnosis not present

## 2024-06-28 DIAGNOSIS — R10A1 Flank pain, right side: Secondary | ICD-10-CM | POA: Diagnosis present

## 2024-06-28 DIAGNOSIS — N281 Cyst of kidney, acquired: Secondary | ICD-10-CM | POA: Diagnosis not present

## 2024-06-28 DIAGNOSIS — K5750 Diverticulosis of both small and large intestine without perforation or abscess without bleeding: Secondary | ICD-10-CM | POA: Insufficient documentation

## 2024-06-28 LAB — CBC WITH DIFFERENTIAL/PLATELET
Abs Immature Granulocytes: 0.02 K/uL (ref 0.00–0.07)
Basophils Absolute: 0 K/uL (ref 0.0–0.1)
Basophils Relative: 0 %
Eosinophils Absolute: 0.1 K/uL (ref 0.0–0.5)
Eosinophils Relative: 1 %
HCT: 39.9 % (ref 36.0–46.0)
Hemoglobin: 13.6 g/dL (ref 12.0–15.0)
Immature Granulocytes: 0 %
Lymphocytes Relative: 23 %
Lymphs Abs: 1.6 K/uL (ref 0.7–4.0)
MCH: 31.3 pg (ref 26.0–34.0)
MCHC: 34.1 g/dL (ref 30.0–36.0)
MCV: 91.7 fL (ref 80.0–100.0)
Monocytes Absolute: 0.6 K/uL (ref 0.1–1.0)
Monocytes Relative: 8 %
Neutro Abs: 4.6 K/uL (ref 1.7–7.7)
Neutrophils Relative %: 68 %
Platelets: 184 K/uL (ref 150–400)
RBC: 4.35 MIL/uL (ref 3.87–5.11)
RDW: 14 % (ref 11.5–15.5)
WBC: 6.9 K/uL (ref 4.0–10.5)
nRBC: 0 % (ref 0.0–0.2)

## 2024-06-28 LAB — COMPREHENSIVE METABOLIC PANEL WITH GFR
ALT: 12 U/L (ref 0–44)
AST: 17 U/L (ref 15–41)
Albumin: 4 g/dL (ref 3.5–5.0)
Alkaline Phosphatase: 75 U/L (ref 38–126)
Anion gap: 10 (ref 5–15)
BUN: 14 mg/dL (ref 8–23)
CO2: 26 mmol/L (ref 22–32)
Calcium: 9.5 mg/dL (ref 8.9–10.3)
Chloride: 105 mmol/L (ref 98–111)
Creatinine, Ser: 0.74 mg/dL (ref 0.44–1.00)
GFR, Estimated: >60 mL/min (ref >60)
Glucose, Bld: 108 mg/dL — ABNORMAL HIGH (ref 70–99)
Potassium: 3.7 mmol/L (ref 3.5–5.1)
Sodium: 142 mmol/L (ref 135–145)
Total Bilirubin: 0.5 mg/dL (ref 0.0–1.2)
Total Protein: 6.9 g/dL (ref 6.5–8.1)

## 2024-06-28 LAB — URINALYSIS, ROUTINE W REFLEX MICROSCOPIC
Bacteria, UA: NONE SEEN
Bilirubin Urine: NEGATIVE
Glucose, UA: NEGATIVE mg/dL
Hgb urine dipstick: NEGATIVE
Nitrite: NEGATIVE
Specific Gravity, Urine: 1.031 — ABNORMAL HIGH (ref 1.005–1.030)
WBC, UA: >50 WBC/hpf (ref 0–5)
pH: 5.5 (ref 5.0–8.0)

## 2024-06-28 LAB — LIPASE, BLOOD: Lipase: 27 U/L (ref 11–51)

## 2024-06-28 MED ORDER — ONDANSETRON HCL 4 MG/2ML IJ SOLN
4.0000 mg | Freq: Once | INTRAMUSCULAR | Status: AC
  Administered 2024-06-28: 4 mg via INTRAVENOUS
  Filled 2024-06-28: qty 2

## 2024-06-28 MED ORDER — PROMETHAZINE HCL 25 MG PO TABS: 25.0000 mg | ORAL_TABLET | Freq: Four times a day (QID) | ORAL | 0 refills | Status: AC | PRN

## 2024-06-28 MED ORDER — IOHEXOL 300 MG/ML  SOLN
100.0000 mL | Freq: Once | INTRAMUSCULAR | Status: AC | PRN
  Administered 2024-06-28: 100 mL via INTRAVENOUS

## 2024-06-28 MED ORDER — METOCLOPRAMIDE HCL 5 MG/ML IJ SOLN
10.0000 mg | Freq: Once | INTRAMUSCULAR | Status: AC
  Administered 2024-06-28: 10 mg via INTRAVENOUS
  Filled 2024-06-28: qty 2

## 2024-06-28 MED ORDER — KETOROLAC TROMETHAMINE 15 MG/ML IJ SOLN
15.0000 mg | Freq: Once | INTRAMUSCULAR | Status: DC
  Filled 2024-06-28: qty 1

## 2024-06-28 NOTE — ED Triage Notes (Signed)
 Patient states right sided flank pain for 2 weeks that radiates to back. States has been on antibiotic for several days for UTI.

## 2024-06-28 NOTE — ED Provider Notes (Signed)
 [place] EMERGENCY DEPARTMENT AT [facility] Provider Note   CSN: [id number] Arrival date & time: 06/28/24  1318     Patient presents with: Flank Pain   Lori Lester is a 68 y.o. female past medical history significant for diverticulitis, hypertension, and GERD presents today for right sided flank pain x 2 weeks that radiates into the back.  Patient reports she has been on Keflex  for several days for a UTI prescribed to her by her provider at [facility] urology.  Patient states that she has had fatigue and generalized not feeling well for the past month.  Patient reports nausea, right flank and right upper quadrant pain.  Patient denies chest pain, shortness of breath, fever, vomiting, constipation, diarrhea, dysuria, hematuria, urgency, or frequency.    Flank Pain Associated symptoms include abdominal pain.       Prior to Admission medications   Medication Sig Start Date End Date Taking? Authorizing Provider  busPIRone (BUSPAR) 10 MG tablet Take 10 mg by mouth 2 (two) times daily. 02/29/24  Yes [provider]  promethazine  (PHENERGAN ) 25 MG tablet Take 1 tablet (25 mg total) by mouth every 6 (six) hours as needed for nausea or vomiting. 06/28/24  Yes Keisha Amer N, PA-C  rosuvastatin (CRESTOR) 5 MG tablet Take 10 mg by mouth daily. 03/01/24  Yes [provider]  LORazepam  (ATIVAN ) 0.5 MG tablet Take 0.5 mg by mouth 2 (two) times daily as needed. 11/19/21   [provider]  meclizine (ANTIVERT) 25 MG tablet Take 25 mg by mouth 3 (three) times daily as needed for dizziness (vertigo).    [provider]  nitroGLYCERIN  (NITROSTAT ) 0.4 MG SL tablet Place 1 tablet (0.4 mg total) under the tongue every 5 (five) minutes as needed for chest pain. 07/31/22   Chandrasekhar, Stanly LABOR, MD  omeprazole  (PRILOSEC) 40 MG capsule Take 40 mg by mouth daily.    [provider]  propranolol  (INDERAL ) 10 MG tablet Take 1 tablet (10 mg total) by  mouth 3 (three) times daily. This is the final dose, follow instructions provided verbally and in after visit summary (AVS in MyChart). Patient not taking: Reported on 08/03/2023 05/20/23   Athar, Saima, MD  sertraline (ZOLOFT) 100 MG tablet Take 100 mg by mouth daily.    [provider]    Allergies: Amoxicillin, Ciprofloxacin , Codeine, Duloxetine , Sulfa antibiotics, and Venlafaxine    Review of Systems  Constitutional:  Positive for fatigue.  Gastrointestinal:  Positive for abdominal pain and nausea.  Genitourinary:  Positive for flank pain.    Updated Vital Signs BP 133/66   Pulse 71   Temp 98.3 F (36.8 C)   Resp 18   SpO2 100%   Physical Exam Vitals and nursing note reviewed.  Constitutional:      General: She is not in acute distress.    Appearance: She is well-developed. She is not toxic-appearing.  HENT:     Head: Normocephalic and atraumatic.     Mouth/Throat:     Mouth: Mucous membranes are moist.     Pharynx: Oropharynx is clear.  Eyes:     Conjunctiva/sclera: Conjunctivae normal.  Cardiovascular:     Rate and Rhythm: Normal rate and regular rhythm.     Pulses: Normal pulses.     Heart sounds: Normal heart sounds. No murmur heard. Pulmonary:     Effort: Pulmonary effort is normal. No respiratory distress.     Breath sounds: Normal breath sounds.  Abdominal:  General: There is no distension.     Palpations: Abdomen is soft.     Tenderness: There is abdominal tenderness in the right upper quadrant. There is right CVA tenderness. There is no left CVA tenderness. Negative signs include Murphy's sign, Rovsing's sign, McBurney's sign and psoas sign.  Musculoskeletal:        General: No swelling.     Cervical back: Normal range of motion and neck supple.  Skin:    General: Skin is warm and dry.     Capillary Refill: Capillary refill takes less than 2 seconds.  Neurological:     General: No focal deficit present.     Mental Status: She is alert and  oriented to person, place, and time.  Psychiatric:        Mood and Affect: Mood normal.     (all labs ordered are listed, but only abnormal results are displayed) Labs Reviewed  URINALYSIS, ROUTINE W REFLEX MICROSCOPIC - Abnormal; Notable for the following components:      Result Value   APPearance HAZY (*)    Specific Gravity, Urine 1.031 (*)    Ketones, ur TRACE (*)    Protein, ur TRACE (*)    Leukocytes,Ua LARGE (*)    All other components within normal limits  COMPREHENSIVE METABOLIC PANEL WITH GFR - Abnormal; Notable for the following components:   Glucose, Bld 108 (*)    All other components within normal limits  CBC WITH DIFFERENTIAL/PLATELET  LIPASE, BLOOD    EKG: None  Radiology: CT ABDOMEN PELVIS W CONTRAST Result Date: 06/28/2024 CLINICAL DATA:  Abdominal pain and flank pain. EXAM: CT ABDOMEN AND PELVIS WITH CONTRAST TECHNIQUE: Multidetector CT imaging of the abdomen and pelvis was performed using the standard protocol following bolus administration of intravenous contrast. RADIATION DOSE REDUCTION: This exam was performed according to the departmental dose-optimization program which includes automated exposure control, adjustment of the mA and/or kV according to patient size and/or use of iterative reconstruction technique. CONTRAST:  OMNIPAQUE  IOHEXOL  300 MG/ML  SOLN COMPARISON:  CT abdomen and pelvis 12/10/2023 FINDINGS: Lower chest: No acute abnormality. Hepatobiliary: No focal liver abnormality is seen. No gallstones, gallbladder wall thickening, or biliary dilatation. Pancreas: Unremarkable. No pancreatic ductal dilatation or surrounding inflammatory changes. Spleen: Normal in size without focal abnormality. Adrenals/Urinary Tract: Small left peripelvic cysts are present. The kidneys are otherwise within normal limits. Adrenal glands and bladder are within normal limits. Stomach/Bowel: There is diffuse colonic diverticulosis severe in the sigmoid colon. There is no  acute inflammation, bowel obstruction, pneumatosis or free air. The appendix appears normal. There is a small hiatal hernia. The stomach is otherwise within normal limits. 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 Vascular/Lymphatic: Aortic atherosclerosis. No enlarged abdominal or pelvic lymph nodes. Reproductive: Status post hysterectomy. No adnexal masses. Other: No abdominal wall hernia or abnormality. No abdominopelvic ascites. Musculoskeletal: Degenerative changes affect the spine. IMPRESSION: 1. No acute localizing process in the abdomen or pelvis. 2. Colonic diverticulosis without evidence for acute diverticulitis. 3. Small hiatal hernia. 4. Aortic atherosclerosis. Aortic Atherosclerosis (ICD10-I70.0). Electronically Signed   By: Greig Pique M.D.   On: 06/28/2024 17:18     Procedures   Medications Ordered in the ED  metoCLOPramide  (REGLAN ) injection 10 mg (has no administration in time range)  ketorolac  (TORADOL ) 15 MG/ML injection 15 mg (has no administration in time range)  ondansetron  (ZOFRAN ) injection 4 mg (4 mg Intravenous Given 06/28/24 1424)  iohexol  (OMNIPAQUE ) 300 MG/ML solution 100 mL (100 mLs Intravenous Contrast  Given 06/28/24 1621)                                    Medical Decision Making Amount and/or Complexity of Data Reviewed Labs: ordered.   This patient presents to the ED for concern of right flank and abdominal pain differential diagnosis includes pyelonephritis, kidney stone, choledocholithiasis, acute cholecystitis, constipation, septic stone, UTI, appendicitis, pancreatitis, diverticulitis    Additional history obtained   Additional history obtained from Electronic Medical Record External records from outside source obtained and reviewed including Care Everywhere   Lab Tests:  I Ordered, and personally interpreted labs.  The pertinent results include: UA with trace ketones, large leukocytes, trace protein, elevated specific gravity, 6-10 RBCs and squamous  epithelials, greater than 50 WBCs, CBC WNL, CMP WNL, Lipase 37   Imaging Studies ordered:  I ordered imaging studies including CT abdomen pelvis with contrast I independently visualized and interpreted imaging which showed no acute localizing process in the abdomen or pelvis.  Colonic diverticulosis without acute diverticulitis. I agree with the radiologist interpretation   Medicines ordered and prescription drug management:  I ordered medication including Zofran , Reglan     I have reviewed the patients home medicines and have made adjustments as needed   Problem List / ED Course:  Consulted urology, Dr. Elisabeth he stated the patient could follow-up with the clinic tomorrow for possible antibiotic change and symptomatic management in the meantime. Considered for admission or further workup however patient's vital signs, physical exam, labs, and imaging are reassuring.  Patient denied management with antiemetic and analgesic outpatient.  [facility] to [facility] with [facility] as soon as possible for further evaluation workup.  Patient given return precautions.  I feel patient safer discharge at this time.     Final diagnoses:  Urinary tract infection with hematuria, site unspecified    ED Discharge Orders          Ordered    promethazine  (PHENERGAN ) 25 MG tablet  Every 6 hours PRN        06/28/24 1743               Francis Ileana SAILOR, PA-C 06/28/24 1812    Lenor Hollering, MD 06/29/24 1505

## 2024-06-28 NOTE — Discharge Instructions (Signed)
 Today you were seen for a urinary tract infection.  You have been prescribed Phenergan  for nausea and vomiting.  You may also alternate Tylenol  Motrin as needed for pain.  Please [facility]up with [facility] by calling their office tomorrow for further evaluation and workup.  Thank you for letting us  treat you today. After reviewing your labs and imaging, I feel you are safe to go home. Please follow up with your PCP in the next several days and provide them with your records from this visit. Return to the Emergency Room if pain becomes severe or symptoms worsen.

## 2024-06-30 DIAGNOSIS — R399 Unspecified symptoms and signs involving the genitourinary system: Secondary | ICD-10-CM | POA: Diagnosis not present

## 2024-08-28 ENCOUNTER — Other Ambulatory Visit: Payer: Self-pay

## 2025-01-04 ENCOUNTER — Other Ambulatory Visit (HOSPITAL_BASED_OUTPATIENT_CLINIC_OR_DEPARTMENT_OTHER)
